# Patient Record
Sex: Male | Born: 1960 | Race: White | Hispanic: No | Smoking: Never smoker
Health system: Southern US, Community
[De-identification: ages and names within clinical notes are randomized; demographics above are authoritative.]

## PROBLEM LIST (undated history)

## (undated) DIAGNOSIS — D376 Neoplasm of uncertain behavior of liver, gallbladder and bile ducts: Secondary | ICD-10-CM

## (undated) DIAGNOSIS — F419 Anxiety disorder, unspecified: Secondary | ICD-10-CM

## (undated) DIAGNOSIS — K766 Portal hypertension: Secondary | ICD-10-CM

## (undated) DIAGNOSIS — G609 Hereditary and idiopathic neuropathy, unspecified: Secondary | ICD-10-CM

## (undated) DIAGNOSIS — I8500 Esophageal varices without bleeding: Secondary | ICD-10-CM

## (undated) DIAGNOSIS — F411 Generalized anxiety disorder: Secondary | ICD-10-CM

## (undated) DIAGNOSIS — B1710 Acute hepatitis C without hepatic coma: Secondary | ICD-10-CM

## (undated) DIAGNOSIS — F1021 Alcohol dependence, in remission: Secondary | ICD-10-CM

## (undated) DIAGNOSIS — IMO0002 Reserved for concepts with insufficient information to code with codable children: Secondary | ICD-10-CM

## (undated) DIAGNOSIS — E1149 Type 2 diabetes mellitus with other diabetic neurological complication: Secondary | ICD-10-CM

## (undated) DIAGNOSIS — D696 Thrombocytopenia, unspecified: Secondary | ICD-10-CM

## (undated) DIAGNOSIS — K7460 Unspecified cirrhosis of liver: Secondary | ICD-10-CM

## (undated) DIAGNOSIS — G20 Parkinson's disease: Secondary | ICD-10-CM

## (undated) DIAGNOSIS — J189 Pneumonia, unspecified organism: Secondary | ICD-10-CM

## (undated) DIAGNOSIS — J9601 Acute respiratory failure with hypoxia: Secondary | ICD-10-CM

## (undated) DIAGNOSIS — E785 Hyperlipidemia, unspecified: Secondary | ICD-10-CM

## (undated) DIAGNOSIS — G20A1 Parkinson's disease without dyskinesia, without mention of fluctuations: Secondary | ICD-10-CM

## (undated) DIAGNOSIS — R945 Abnormal results of liver function studies: Secondary | ICD-10-CM

## (undated) DIAGNOSIS — K8020 Calculus of gallbladder without cholecystitis without obstruction: Secondary | ICD-10-CM

## (undated) DIAGNOSIS — Q742 Other congenital malformations of lower limb(s), including pelvic girdle: Secondary | ICD-10-CM

## (undated) HISTORY — DX: Calculus of gallbladder without cholecystitis without obstruction: K80.20

## (undated) HISTORY — DX: Neoplasm of uncertain behavior of liver, gallbladder and bile ducts: D37.6

## (undated) HISTORY — DX: Alcohol dependence, in remission: F10.21

## (undated) HISTORY — DX: Esophageal varices without bleeding: I85.00

## (undated) HISTORY — DX: Parkinson's disease: G20

## (undated) HISTORY — DX: Unspecified cirrhosis of liver: K74.60

## (undated) HISTORY — DX: Anxiety disorder, unspecified: F41.9

## (undated) HISTORY — DX: Generalized anxiety disorder: F41.1

## (undated) HISTORY — DX: Portal hypertension: K76.6

## (undated) HISTORY — DX: Hyperlipidemia, unspecified: E78.5

## (undated) HISTORY — DX: Abnormal results of liver function studies: R94.5

## (undated) HISTORY — DX: Acute hepatitis C without hepatic coma: B17.10

## (undated) HISTORY — DX: Thrombocytopenia, unspecified: D69.6

## (undated) HISTORY — DX: Type 2 diabetes mellitus with other diabetic neurological complication: E11.49

## (undated) HISTORY — DX: Other congenital malformations of lower limb(s), including pelvic girdle: Q74.2

## (undated) HISTORY — DX: Hereditary and idiopathic neuropathy, unspecified: G60.9

## (undated) HISTORY — DX: Reserved for concepts with insufficient information to code with codable children: IMO0002

## (undated) HISTORY — PX: CHOLECYSTECTOMY: SHX55

## (undated) HISTORY — DX: Pneumonia, unspecified organism: J18.9

## (undated) HISTORY — DX: Acute respiratory failure with hypoxia: J96.01

---

## 2005-06-15 ENCOUNTER — Encounter (INDEPENDENT_AMBULATORY_CARE_PROVIDER_SITE_OTHER): Payer: Self-pay | Admitting: Internal Medicine

## 2006-03-06 ENCOUNTER — Emergency Department (HOSPITAL_COMMUNITY): Admission: EM | Admit: 2006-03-06 | Discharge: 2006-03-06 | Payer: Self-pay | Admitting: Emergency Medicine

## 2006-09-09 ENCOUNTER — Emergency Department (HOSPITAL_COMMUNITY): Admission: EM | Admit: 2006-09-09 | Discharge: 2006-09-09 | Payer: Self-pay | Admitting: Emergency Medicine

## 2006-11-27 ENCOUNTER — Emergency Department (HOSPITAL_COMMUNITY): Admission: EM | Admit: 2006-11-27 | Discharge: 2006-11-27 | Payer: Self-pay | Admitting: Emergency Medicine

## 2006-12-22 ENCOUNTER — Ambulatory Visit: Payer: Self-pay | Admitting: Internal Medicine

## 2006-12-28 ENCOUNTER — Encounter: Payer: Self-pay | Admitting: Internal Medicine

## 2007-01-04 ENCOUNTER — Ambulatory Visit: Payer: Self-pay | Admitting: *Deleted

## 2007-01-24 ENCOUNTER — Ambulatory Visit: Payer: Self-pay | Admitting: Internal Medicine

## 2007-01-24 DIAGNOSIS — R945 Abnormal results of liver function studies: Secondary | ICD-10-CM | POA: Insufficient documentation

## 2007-01-24 HISTORY — DX: Abnormal results of liver function studies: R94.5

## 2007-01-25 ENCOUNTER — Encounter (INDEPENDENT_AMBULATORY_CARE_PROVIDER_SITE_OTHER): Payer: Self-pay | Admitting: Internal Medicine

## 2007-02-09 ENCOUNTER — Ambulatory Visit: Payer: Self-pay | Admitting: Internal Medicine

## 2007-02-21 ENCOUNTER — Encounter: Admission: RE | Admit: 2007-02-21 | Discharge: 2007-04-12 | Payer: Self-pay | Admitting: Internal Medicine

## 2007-02-23 ENCOUNTER — Ambulatory Visit: Payer: Self-pay | Admitting: Internal Medicine

## 2007-03-07 ENCOUNTER — Ambulatory Visit: Payer: Self-pay | Admitting: Internal Medicine

## 2007-03-14 ENCOUNTER — Ambulatory Visit: Payer: Self-pay | Admitting: Internal Medicine

## 2007-03-28 ENCOUNTER — Ambulatory Visit: Payer: Self-pay | Admitting: Gastroenterology

## 2007-03-28 ENCOUNTER — Encounter (INDEPENDENT_AMBULATORY_CARE_PROVIDER_SITE_OTHER): Payer: Self-pay | Admitting: Internal Medicine

## 2007-04-03 ENCOUNTER — Ambulatory Visit: Payer: Self-pay | Admitting: Family Medicine

## 2007-04-27 ENCOUNTER — Encounter (INDEPENDENT_AMBULATORY_CARE_PROVIDER_SITE_OTHER): Payer: Self-pay | Admitting: Internal Medicine

## 2007-04-27 DIAGNOSIS — G609 Hereditary and idiopathic neuropathy, unspecified: Secondary | ICD-10-CM | POA: Insufficient documentation

## 2007-04-27 DIAGNOSIS — E119 Type 2 diabetes mellitus without complications: Secondary | ICD-10-CM | POA: Insufficient documentation

## 2007-04-27 HISTORY — DX: Hereditary and idiopathic neuropathy, unspecified: G60.9

## 2007-05-02 ENCOUNTER — Ambulatory Visit (HOSPITAL_COMMUNITY): Admission: RE | Admit: 2007-05-02 | Discharge: 2007-05-02 | Payer: Self-pay | Admitting: Gastroenterology

## 2007-05-16 DIAGNOSIS — E785 Hyperlipidemia, unspecified: Secondary | ICD-10-CM

## 2007-05-16 HISTORY — DX: Hyperlipidemia, unspecified: E78.5

## 2007-05-26 ENCOUNTER — Ambulatory Visit (HOSPITAL_COMMUNITY): Admission: RE | Admit: 2007-05-26 | Discharge: 2007-05-26 | Payer: Self-pay | Admitting: Gastroenterology

## 2007-05-26 DIAGNOSIS — E278 Other specified disorders of adrenal gland: Secondary | ICD-10-CM | POA: Insufficient documentation

## 2007-05-26 DIAGNOSIS — D376 Neoplasm of uncertain behavior of liver, gallbladder and bile ducts: Secondary | ICD-10-CM | POA: Insufficient documentation

## 2007-05-26 DIAGNOSIS — K8020 Calculus of gallbladder without cholecystitis without obstruction: Secondary | ICD-10-CM | POA: Insufficient documentation

## 2007-05-26 HISTORY — DX: Neoplasm of uncertain behavior of liver, gallbladder and bile ducts: D37.6

## 2007-05-26 HISTORY — DX: Calculus of gallbladder without cholecystitis without obstruction: K80.20

## 2007-05-28 ENCOUNTER — Telehealth (INDEPENDENT_AMBULATORY_CARE_PROVIDER_SITE_OTHER): Payer: Self-pay | Admitting: Internal Medicine

## 2007-05-29 ENCOUNTER — Encounter (INDEPENDENT_AMBULATORY_CARE_PROVIDER_SITE_OTHER): Payer: Self-pay | Admitting: Internal Medicine

## 2007-06-06 ENCOUNTER — Telehealth (INDEPENDENT_AMBULATORY_CARE_PROVIDER_SITE_OTHER): Payer: Self-pay | Admitting: Internal Medicine

## 2007-06-12 ENCOUNTER — Ambulatory Visit: Payer: Self-pay | Admitting: Nurse Practitioner

## 2007-06-12 LAB — CONVERTED CEMR LAB: Hgb A1c MFr Bld: 10.6 %

## 2007-06-14 ENCOUNTER — Encounter (INDEPENDENT_AMBULATORY_CARE_PROVIDER_SITE_OTHER): Payer: Self-pay | Admitting: Nurse Practitioner

## 2007-06-29 ENCOUNTER — Telehealth (INDEPENDENT_AMBULATORY_CARE_PROVIDER_SITE_OTHER): Payer: Self-pay | Admitting: Internal Medicine

## 2007-06-30 ENCOUNTER — Telehealth (INDEPENDENT_AMBULATORY_CARE_PROVIDER_SITE_OTHER): Payer: Self-pay | Admitting: Internal Medicine

## 2007-07-06 DIAGNOSIS — K766 Portal hypertension: Secondary | ICD-10-CM

## 2007-07-06 DIAGNOSIS — F1021 Alcohol dependence, in remission: Secondary | ICD-10-CM

## 2007-07-06 DIAGNOSIS — K7460 Unspecified cirrhosis of liver: Secondary | ICD-10-CM

## 2007-07-06 HISTORY — DX: Portal hypertension: K76.6

## 2007-07-06 HISTORY — DX: Unspecified cirrhosis of liver: K74.60

## 2007-07-06 HISTORY — DX: Alcohol dependence, in remission: F10.21

## 2007-07-07 ENCOUNTER — Encounter (INDEPENDENT_AMBULATORY_CARE_PROVIDER_SITE_OTHER): Payer: Self-pay | Admitting: Internal Medicine

## 2007-08-02 ENCOUNTER — Ambulatory Visit: Payer: Self-pay | Admitting: Gastroenterology

## 2007-08-02 LAB — CONVERTED CEMR LAB: AFP-Tumor Marker: 7.1 ng/mL (ref 0.0–8.0)

## 2007-08-17 ENCOUNTER — Encounter: Payer: Self-pay | Admitting: Gastroenterology

## 2007-08-17 ENCOUNTER — Ambulatory Visit (HOSPITAL_COMMUNITY): Admission: RE | Admit: 2007-08-17 | Discharge: 2007-08-17 | Payer: Self-pay | Admitting: Gastroenterology

## 2007-08-22 ENCOUNTER — Telehealth (INDEPENDENT_AMBULATORY_CARE_PROVIDER_SITE_OTHER): Payer: Self-pay | Admitting: Internal Medicine

## 2007-08-22 ENCOUNTER — Ambulatory Visit: Payer: Self-pay | Admitting: Gastroenterology

## 2007-09-27 DIAGNOSIS — F411 Generalized anxiety disorder: Secondary | ICD-10-CM

## 2007-09-27 DIAGNOSIS — F329 Major depressive disorder, single episode, unspecified: Secondary | ICD-10-CM

## 2007-09-27 DIAGNOSIS — K5900 Constipation, unspecified: Secondary | ICD-10-CM | POA: Insufficient documentation

## 2007-09-27 HISTORY — DX: Generalized anxiety disorder: F41.1

## 2007-10-03 ENCOUNTER — Ambulatory Visit: Payer: Self-pay | Admitting: Internal Medicine

## 2007-10-03 DIAGNOSIS — I10 Essential (primary) hypertension: Secondary | ICD-10-CM | POA: Insufficient documentation

## 2007-10-06 ENCOUNTER — Encounter (INDEPENDENT_AMBULATORY_CARE_PROVIDER_SITE_OTHER): Payer: Self-pay | Admitting: Internal Medicine

## 2007-10-13 ENCOUNTER — Telehealth (INDEPENDENT_AMBULATORY_CARE_PROVIDER_SITE_OTHER): Payer: Self-pay | Admitting: Internal Medicine

## 2007-10-17 ENCOUNTER — Encounter (INDEPENDENT_AMBULATORY_CARE_PROVIDER_SITE_OTHER): Payer: Self-pay | Admitting: Internal Medicine

## 2007-11-03 ENCOUNTER — Ambulatory Visit: Payer: Self-pay | Admitting: Internal Medicine

## 2007-11-08 ENCOUNTER — Telehealth (INDEPENDENT_AMBULATORY_CARE_PROVIDER_SITE_OTHER): Payer: Self-pay | Admitting: *Deleted

## 2007-12-28 ENCOUNTER — Ambulatory Visit: Payer: Self-pay | Admitting: Internal Medicine

## 2008-01-03 ENCOUNTER — Emergency Department (HOSPITAL_COMMUNITY): Admission: EM | Admit: 2008-01-03 | Discharge: 2008-01-03 | Payer: Self-pay | Admitting: Emergency Medicine

## 2008-02-07 ENCOUNTER — Telehealth (INDEPENDENT_AMBULATORY_CARE_PROVIDER_SITE_OTHER): Payer: Self-pay | Admitting: Internal Medicine

## 2008-02-13 ENCOUNTER — Ambulatory Visit: Payer: Self-pay | Admitting: Internal Medicine

## 2008-02-13 LAB — CONVERTED CEMR LAB
Blood Glucose, Fingerstick: 104
CO2: 19 meq/L (ref 19–32)
Calcium: 9.6 mg/dL (ref 8.4–10.5)
Creatinine, Ser: 0.48 mg/dL (ref 0.40–1.50)
Glucose, Bld: 93 mg/dL (ref 70–99)

## 2008-02-15 ENCOUNTER — Telehealth (INDEPENDENT_AMBULATORY_CARE_PROVIDER_SITE_OTHER): Payer: Self-pay | Admitting: Internal Medicine

## 2008-02-20 ENCOUNTER — Encounter (INDEPENDENT_AMBULATORY_CARE_PROVIDER_SITE_OTHER): Payer: Self-pay | Admitting: Internal Medicine

## 2008-02-29 ENCOUNTER — Ambulatory Visit (HOSPITAL_COMMUNITY): Admission: RE | Admit: 2008-02-29 | Discharge: 2008-02-29 | Payer: Self-pay | Admitting: Internal Medicine

## 2008-03-06 ENCOUNTER — Telehealth (INDEPENDENT_AMBULATORY_CARE_PROVIDER_SITE_OTHER): Payer: Self-pay | Admitting: Internal Medicine

## 2008-03-12 ENCOUNTER — Ambulatory Visit: Payer: Self-pay | Admitting: Internal Medicine

## 2008-03-12 LAB — CONVERTED CEMR LAB: Blood Glucose, Fingerstick: 140

## 2008-03-19 ENCOUNTER — Ambulatory Visit (HOSPITAL_COMMUNITY): Admission: RE | Admit: 2008-03-19 | Discharge: 2008-03-19 | Payer: Self-pay | Admitting: Internal Medicine

## 2008-03-22 ENCOUNTER — Encounter (INDEPENDENT_AMBULATORY_CARE_PROVIDER_SITE_OTHER): Payer: Self-pay | Admitting: Internal Medicine

## 2008-03-22 DIAGNOSIS — G20 Parkinson's disease: Secondary | ICD-10-CM

## 2008-03-22 HISTORY — DX: Parkinson's disease: G20

## 2008-04-02 ENCOUNTER — Encounter (INDEPENDENT_AMBULATORY_CARE_PROVIDER_SITE_OTHER): Payer: Self-pay | Admitting: *Deleted

## 2008-05-13 ENCOUNTER — Encounter (INDEPENDENT_AMBULATORY_CARE_PROVIDER_SITE_OTHER): Payer: Self-pay | Admitting: Internal Medicine

## 2008-05-16 ENCOUNTER — Encounter (INDEPENDENT_AMBULATORY_CARE_PROVIDER_SITE_OTHER): Payer: Self-pay | Admitting: Internal Medicine

## 2008-06-17 ENCOUNTER — Telehealth (INDEPENDENT_AMBULATORY_CARE_PROVIDER_SITE_OTHER): Payer: Self-pay | Admitting: Internal Medicine

## 2008-07-11 ENCOUNTER — Encounter (INDEPENDENT_AMBULATORY_CARE_PROVIDER_SITE_OTHER): Payer: Self-pay | Admitting: Internal Medicine

## 2008-07-11 ENCOUNTER — Ambulatory Visit: Payer: Self-pay | Admitting: Gastroenterology

## 2008-08-26 ENCOUNTER — Emergency Department (HOSPITAL_COMMUNITY): Admission: EM | Admit: 2008-08-26 | Discharge: 2008-08-26 | Payer: Self-pay | Admitting: Emergency Medicine

## 2008-08-26 ENCOUNTER — Telehealth (INDEPENDENT_AMBULATORY_CARE_PROVIDER_SITE_OTHER): Payer: Self-pay | Admitting: Internal Medicine

## 2008-09-11 ENCOUNTER — Telehealth (INDEPENDENT_AMBULATORY_CARE_PROVIDER_SITE_OTHER): Payer: Self-pay | Admitting: Internal Medicine

## 2008-09-11 ENCOUNTER — Encounter (INDEPENDENT_AMBULATORY_CARE_PROVIDER_SITE_OTHER): Payer: Self-pay | Admitting: Internal Medicine

## 2008-09-24 ENCOUNTER — Ambulatory Visit: Payer: Self-pay | Admitting: Internal Medicine

## 2008-09-24 LAB — CONVERTED CEMR LAB: Hgb A1c MFr Bld: 7.3 %

## 2008-10-01 ENCOUNTER — Telehealth (INDEPENDENT_AMBULATORY_CARE_PROVIDER_SITE_OTHER): Payer: Self-pay | Admitting: Internal Medicine

## 2008-10-03 ENCOUNTER — Ambulatory Visit: Payer: Self-pay | Admitting: Gastroenterology

## 2008-10-03 ENCOUNTER — Encounter (INDEPENDENT_AMBULATORY_CARE_PROVIDER_SITE_OTHER): Payer: Self-pay | Admitting: Internal Medicine

## 2008-10-08 ENCOUNTER — Encounter (INDEPENDENT_AMBULATORY_CARE_PROVIDER_SITE_OTHER): Payer: Self-pay | Admitting: *Deleted

## 2008-10-10 ENCOUNTER — Encounter (INDEPENDENT_AMBULATORY_CARE_PROVIDER_SITE_OTHER): Payer: Self-pay | Admitting: Internal Medicine

## 2008-10-10 ENCOUNTER — Ambulatory Visit: Payer: Self-pay | Admitting: Gastroenterology

## 2008-10-22 ENCOUNTER — Ambulatory Visit: Payer: Self-pay | Admitting: Internal Medicine

## 2008-10-24 ENCOUNTER — Ambulatory Visit: Payer: Self-pay | Admitting: Gastroenterology

## 2008-10-24 ENCOUNTER — Encounter (INDEPENDENT_AMBULATORY_CARE_PROVIDER_SITE_OTHER): Payer: Self-pay | Admitting: Internal Medicine

## 2008-10-29 ENCOUNTER — Ambulatory Visit: Payer: Self-pay | Admitting: Gastroenterology

## 2008-10-29 DIAGNOSIS — I8500 Esophageal varices without bleeding: Secondary | ICD-10-CM | POA: Insufficient documentation

## 2008-10-29 HISTORY — DX: Esophageal varices without bleeding: I85.00

## 2008-11-07 ENCOUNTER — Ambulatory Visit: Payer: Self-pay | Admitting: Gastroenterology

## 2008-11-07 ENCOUNTER — Encounter (INDEPENDENT_AMBULATORY_CARE_PROVIDER_SITE_OTHER): Payer: Self-pay | Admitting: Internal Medicine

## 2008-11-08 ENCOUNTER — Encounter: Payer: Self-pay | Admitting: Gastroenterology

## 2008-11-08 ENCOUNTER — Ambulatory Visit: Payer: Self-pay | Admitting: Gastroenterology

## 2008-11-11 ENCOUNTER — Telehealth: Payer: Self-pay | Admitting: Gastroenterology

## 2008-11-17 ENCOUNTER — Encounter (INDEPENDENT_AMBULATORY_CARE_PROVIDER_SITE_OTHER): Payer: Self-pay | Admitting: Internal Medicine

## 2008-11-19 ENCOUNTER — Encounter: Payer: Self-pay | Admitting: Gastroenterology

## 2008-11-21 ENCOUNTER — Encounter (INDEPENDENT_AMBULATORY_CARE_PROVIDER_SITE_OTHER): Payer: Self-pay | Admitting: Internal Medicine

## 2008-11-21 ENCOUNTER — Ambulatory Visit: Payer: Self-pay | Admitting: Gastroenterology

## 2008-12-05 ENCOUNTER — Ambulatory Visit: Payer: Self-pay | Admitting: Gastroenterology

## 2008-12-05 ENCOUNTER — Encounter (INDEPENDENT_AMBULATORY_CARE_PROVIDER_SITE_OTHER): Payer: Self-pay | Admitting: Internal Medicine

## 2008-12-26 ENCOUNTER — Ambulatory Visit: Payer: Self-pay | Admitting: Internal Medicine

## 2008-12-26 LAB — CONVERTED CEMR LAB
ALT: 210 units/L — ABNORMAL HIGH (ref 0–53)
BUN: 17 mg/dL (ref 6–23)
CO2: 17 meq/L — ABNORMAL LOW (ref 19–32)
Calcium: 9 mg/dL (ref 8.4–10.5)
Chloride: 105 meq/L (ref 96–112)
Cholesterol: 166 mg/dL (ref 0–200)
Creatinine, Ser: 0.61 mg/dL (ref 0.40–1.50)
Eosinophils Absolute: 0 10*3/uL (ref 0.0–0.7)
Eosinophils Relative: 0 % (ref 0–5)
Glucose, Bld: 89 mg/dL (ref 70–99)
HCT: 43.6 % (ref 39.0–52.0)
Lymphs Abs: 1 10*3/uL (ref 0.7–4.0)
MCV: 90.5 fL (ref 78.0–100.0)
Monocytes Absolute: 0.4 10*3/uL (ref 0.1–1.0)
Monocytes Relative: 13 % — ABNORMAL HIGH (ref 3–12)
Nitrite: POSITIVE
Protein, U semiquant: 30
RBC: 4.82 M/uL (ref 4.22–5.81)
Total CHOL/HDL Ratio: 4.7
Urobilinogen, UA: 1
WBC: 3.1 10*3/uL — ABNORMAL LOW (ref 4.0–10.5)

## 2008-12-30 ENCOUNTER — Encounter (INDEPENDENT_AMBULATORY_CARE_PROVIDER_SITE_OTHER): Payer: Self-pay | Admitting: Internal Medicine

## 2008-12-31 ENCOUNTER — Telehealth: Payer: Self-pay | Admitting: Gastroenterology

## 2009-01-03 ENCOUNTER — Encounter (INDEPENDENT_AMBULATORY_CARE_PROVIDER_SITE_OTHER): Payer: Self-pay | Admitting: Internal Medicine

## 2009-01-06 ENCOUNTER — Telehealth: Payer: Self-pay | Admitting: Gastroenterology

## 2009-01-14 ENCOUNTER — Ambulatory Visit: Payer: Self-pay | Admitting: Gastroenterology

## 2009-01-14 ENCOUNTER — Telehealth: Payer: Self-pay | Admitting: Gastroenterology

## 2009-01-16 ENCOUNTER — Ambulatory Visit: Payer: Self-pay | Admitting: Gastroenterology

## 2009-01-16 ENCOUNTER — Encounter (INDEPENDENT_AMBULATORY_CARE_PROVIDER_SITE_OTHER): Payer: Self-pay | Admitting: Internal Medicine

## 2009-01-23 ENCOUNTER — Encounter (INDEPENDENT_AMBULATORY_CARE_PROVIDER_SITE_OTHER): Payer: Self-pay | Admitting: Internal Medicine

## 2009-01-28 ENCOUNTER — Ambulatory Visit (HOSPITAL_COMMUNITY): Admission: RE | Admit: 2009-01-28 | Discharge: 2009-01-28 | Payer: Self-pay | Admitting: Gastroenterology

## 2009-02-11 ENCOUNTER — Ambulatory Visit: Payer: Self-pay | Admitting: Gastroenterology

## 2009-02-21 ENCOUNTER — Encounter (INDEPENDENT_AMBULATORY_CARE_PROVIDER_SITE_OTHER): Payer: Self-pay | Admitting: Internal Medicine

## 2009-02-21 DIAGNOSIS — B1710 Acute hepatitis C without hepatic coma: Secondary | ICD-10-CM

## 2009-02-21 HISTORY — DX: Acute hepatitis C without hepatic coma: B17.10

## 2009-03-06 ENCOUNTER — Ambulatory Visit: Payer: Self-pay | Admitting: Internal Medicine

## 2009-03-06 DIAGNOSIS — Q742 Other congenital malformations of lower limb(s), including pelvic girdle: Secondary | ICD-10-CM

## 2009-03-06 HISTORY — DX: Other congenital malformations of lower limb(s), including pelvic girdle: Q74.2

## 2009-03-06 LAB — CONVERTED CEMR LAB: Blood Glucose, Fingerstick: 125

## 2009-04-10 ENCOUNTER — Ambulatory Visit (HOSPITAL_COMMUNITY): Admission: RE | Admit: 2009-04-10 | Discharge: 2009-04-10 | Payer: Self-pay | Admitting: Sports Medicine

## 2009-05-21 ENCOUNTER — Encounter (INDEPENDENT_AMBULATORY_CARE_PROVIDER_SITE_OTHER): Payer: Self-pay | Admitting: Internal Medicine

## 2009-06-27 ENCOUNTER — Ambulatory Visit (HOSPITAL_COMMUNITY): Admission: RE | Admit: 2009-06-27 | Discharge: 2009-06-27 | Payer: Self-pay | Admitting: Orthopedic Surgery

## 2009-08-28 ENCOUNTER — Ambulatory Visit (HOSPITAL_COMMUNITY)
Admission: RE | Admit: 2009-08-28 | Discharge: 2009-08-28 | Payer: Self-pay | Admitting: Physical Medicine and Rehabilitation

## 2009-10-09 ENCOUNTER — Emergency Department (HOSPITAL_COMMUNITY): Admission: EM | Admit: 2009-10-09 | Discharge: 2009-10-09 | Payer: Self-pay | Admitting: Emergency Medicine

## 2009-10-28 ENCOUNTER — Encounter (INDEPENDENT_AMBULATORY_CARE_PROVIDER_SITE_OTHER): Payer: Self-pay | Admitting: *Deleted

## 2009-11-18 ENCOUNTER — Inpatient Hospital Stay (HOSPITAL_COMMUNITY): Admission: EM | Admit: 2009-11-18 | Discharge: 2009-11-21 | Payer: Self-pay | Admitting: Emergency Medicine

## 2009-12-20 ENCOUNTER — Emergency Department (HOSPITAL_COMMUNITY): Admission: EM | Admit: 2009-12-20 | Discharge: 2009-12-20 | Payer: Self-pay | Admitting: Emergency Medicine

## 2010-06-10 ENCOUNTER — Telehealth: Payer: Self-pay | Admitting: Gastroenterology

## 2010-09-28 ENCOUNTER — Encounter: Payer: Self-pay | Admitting: Sports Medicine

## 2010-09-28 ENCOUNTER — Encounter: Payer: Self-pay | Admitting: Gastroenterology

## 2010-10-06 NOTE — Progress Notes (Signed)
Summary: Schedule Office Visit   Phone Note Outgoing Call Call back at [facility] Phone [phone]   Call placed by: Harlow Mares CMA Duncan Dull),  June 10, 2010 11:27 AM Call placed to: Patient Summary of Call: patient mailbox is full and he can not recieve any new messages I will try to call him again at a later time.  Initial call taken by: Harlow Mares CMA Duncan Dull),  June 10, 2010 11:28 AM  Follow-up for Phone Call        patients mailbox if full, we will mail him a letter to let him know he is due for his colonoscopy Follow-up by: Harlow Mares CMA Duncan Dull),  June 19, 2010 11:32 AM

## 2010-10-06 NOTE — Letter (Signed)
Summary: Endoscopy- Changed to Office Visit  [facility]  [address] [place], [place] [address]   Phone: [phone]  Fax: [phone]      October 28, 2009 MRN: [id number]   Logan Hernandez [address] [place], [place]  [address]   Dear [name],   According to our records, it is time for you to schedule an Endoscopy. However, after reviewing your medical record, I feel that an office visit would be most appropriate to more completely evaluate you and determine your need for a repeat procedure.  Please call [phone] (option #2) at your convenience to schedule an office visit. If you have any questions, concerns, or feel that this letter is in error, we would appreciate your call.   Sincerely,  Barbette Hair. Arlyce Dice, M.D.   [facility] [phone]

## 2010-10-06 NOTE — Letter (Signed)
Summary: [facility]   ORTHOPAEDIC   Imported By: Arta Bruce 01/28/2010 10:06:56  _____________________________________________________________________  External Attachment:    Type:   Image     Comment:   External Document

## 2010-10-06 NOTE — Consult Note (Signed)
Summary: [facility]  [facility]   Imported By: Sherian Rein 02/20/2010 10:45:52  _____________________________________________________________________  External Attachment:    Type:   Image     Comment:   External Document

## 2010-10-28 ENCOUNTER — Emergency Department (HOSPITAL_COMMUNITY)
Admission: EM | Admit: 2010-10-28 | Discharge: 2010-10-28 | Disposition: A | Discharge status: Skilled Nursing Facility | Payer: Medicaid Other | Attending: Emergency Medicine | Admitting: Emergency Medicine

## 2010-10-28 ENCOUNTER — Emergency Department (HOSPITAL_COMMUNITY): Payer: Medicaid Other

## 2010-10-28 DIAGNOSIS — G20A1 Parkinson's disease without dyskinesia, without mention of fluctuations: Secondary | ICD-10-CM | POA: Insufficient documentation

## 2010-10-28 DIAGNOSIS — E119 Type 2 diabetes mellitus without complications: Secondary | ICD-10-CM | POA: Insufficient documentation

## 2010-10-28 DIAGNOSIS — R071 Chest pain on breathing: Secondary | ICD-10-CM | POA: Insufficient documentation

## 2010-10-28 DIAGNOSIS — G20 Parkinson's disease: Secondary | ICD-10-CM | POA: Insufficient documentation

## 2010-10-28 DIAGNOSIS — T1490XA Injury, unspecified, initial encounter: Secondary | ICD-10-CM | POA: Insufficient documentation

## 2010-10-28 DIAGNOSIS — Z8619 Personal history of other infectious and parasitic diseases: Secondary | ICD-10-CM | POA: Insufficient documentation

## 2010-10-28 DIAGNOSIS — Z794 Long term (current) use of insulin: Secondary | ICD-10-CM | POA: Insufficient documentation

## 2010-10-28 DIAGNOSIS — W19XXXA Unspecified fall, initial encounter: Secondary | ICD-10-CM | POA: Insufficient documentation

## 2010-10-28 DIAGNOSIS — M25569 Pain in unspecified knee: Secondary | ICD-10-CM | POA: Insufficient documentation

## 2010-10-28 DIAGNOSIS — M25559 Pain in unspecified hip: Secondary | ICD-10-CM | POA: Insufficient documentation

## 2010-11-02 ENCOUNTER — Emergency Department (HOSPITAL_COMMUNITY): Payer: Medicaid Other

## 2010-11-02 ENCOUNTER — Emergency Department (HOSPITAL_COMMUNITY)
Admission: EM | Admit: 2010-11-02 | Discharge: 2010-11-02 | Disposition: A | Discharge status: Home / Self Care | Payer: Medicaid Other | Attending: Emergency Medicine | Admitting: Emergency Medicine

## 2010-11-02 DIAGNOSIS — Z794 Long term (current) use of insulin: Secondary | ICD-10-CM | POA: Insufficient documentation

## 2010-11-02 DIAGNOSIS — R51 Headache: Secondary | ICD-10-CM | POA: Insufficient documentation

## 2010-11-02 DIAGNOSIS — R5381 Other malaise: Secondary | ICD-10-CM | POA: Insufficient documentation

## 2010-11-02 DIAGNOSIS — M542 Cervicalgia: Secondary | ICD-10-CM | POA: Insufficient documentation

## 2010-11-02 DIAGNOSIS — W19XXXA Unspecified fall, initial encounter: Secondary | ICD-10-CM | POA: Insufficient documentation

## 2010-11-02 DIAGNOSIS — G20 Parkinson's disease: Secondary | ICD-10-CM | POA: Insufficient documentation

## 2010-11-02 DIAGNOSIS — S0100XA Unspecified open wound of scalp, initial encounter: Secondary | ICD-10-CM | POA: Insufficient documentation

## 2010-11-02 DIAGNOSIS — G589 Mononeuropathy, unspecified: Secondary | ICD-10-CM | POA: Insufficient documentation

## 2010-11-02 DIAGNOSIS — Z8673 Personal history of transient ischemic attack (TIA), and cerebral infarction without residual deficits: Secondary | ICD-10-CM | POA: Insufficient documentation

## 2010-11-02 DIAGNOSIS — E119 Type 2 diabetes mellitus without complications: Secondary | ICD-10-CM | POA: Insufficient documentation

## 2010-11-02 DIAGNOSIS — G20A1 Parkinson's disease without dyskinesia, without mention of fluctuations: Secondary | ICD-10-CM | POA: Insufficient documentation

## 2010-11-02 DIAGNOSIS — M25559 Pain in unspecified hip: Secondary | ICD-10-CM | POA: Insufficient documentation

## 2010-11-02 DIAGNOSIS — Y9210 Unspecified residential institution as the place of occurrence of the external cause: Secondary | ICD-10-CM | POA: Insufficient documentation

## 2010-11-02 LAB — URINALYSIS, ROUTINE W REFLEX MICROSCOPIC
Bilirubin Urine: NEGATIVE
Hgb urine dipstick: NEGATIVE
Ketones, ur: NEGATIVE mg/dL
Nitrite: NEGATIVE
Protein, ur: NEGATIVE mg/dL
Urobilinogen, UA: 1 mg/dL (ref 0.0–1.0)

## 2010-11-02 LAB — DIFFERENTIAL
Basophils Absolute: 0 10*3/uL (ref 0.0–0.1)
Eosinophils Relative: 1 % (ref 0–5)
Lymphocytes Relative: 24 % (ref 12–46)
Monocytes Absolute: 0.4 10*3/uL (ref 0.1–1.0)

## 2010-11-02 LAB — RAPID URINE DRUG SCREEN, HOSP PERFORMED
Amphetamines: NOT DETECTED
Benzodiazepines: NOT DETECTED
Cocaine: NOT DETECTED
Opiates: NOT DETECTED
Tetrahydrocannabinol: NOT DETECTED

## 2010-11-02 LAB — BASIC METABOLIC PANEL
CO2: 26 mEq/L (ref 19–32)
Chloride: 108 mEq/L (ref 96–112)
Creatinine, Ser: 0.69 mg/dL (ref 0.4–1.5)
GFR calc Af Amer: >60 mL/min (ref >60)
Potassium: 4.7 mEq/L (ref 3.5–5.1)
Sodium: 141 mEq/L (ref 135–145)

## 2010-11-02 LAB — CBC
HCT: 38.2 % — ABNORMAL LOW (ref 39.0–52.0)
MCH: 30.1 pg (ref 26.0–34.0)
MCHC: 34.6 g/dL (ref 30.0–36.0)
RDW: 12.8 % (ref 11.5–15.5)

## 2010-11-02 LAB — POCT CARDIAC MARKERS: Myoglobin, poc: 79.5 ng/mL (ref 12–200)

## 2010-11-09 ENCOUNTER — Emergency Department (HOSPITAL_COMMUNITY): Payer: Medicaid Other

## 2010-11-09 ENCOUNTER — Emergency Department (HOSPITAL_COMMUNITY)
Admission: EM | Admit: 2010-11-09 | Discharge: 2010-11-09 | Disposition: A | Discharge status: Home / Self Care | Payer: Medicaid Other | Attending: Emergency Medicine | Admitting: Emergency Medicine

## 2010-11-09 DIAGNOSIS — G589 Mononeuropathy, unspecified: Secondary | ICD-10-CM | POA: Insufficient documentation

## 2010-11-09 DIAGNOSIS — M25559 Pain in unspecified hip: Secondary | ICD-10-CM | POA: Insufficient documentation

## 2010-11-09 DIAGNOSIS — G20 Parkinson's disease: Secondary | ICD-10-CM | POA: Insufficient documentation

## 2010-11-09 DIAGNOSIS — G20A1 Parkinson's disease without dyskinesia, without mention of fluctuations: Secondary | ICD-10-CM | POA: Insufficient documentation

## 2010-11-09 DIAGNOSIS — Z9889 Other specified postprocedural states: Secondary | ICD-10-CM | POA: Insufficient documentation

## 2010-11-09 DIAGNOSIS — E119 Type 2 diabetes mellitus without complications: Secondary | ICD-10-CM | POA: Insufficient documentation

## 2010-11-09 DIAGNOSIS — Z8673 Personal history of transient ischemic attack (TIA), and cerebral infarction without residual deficits: Secondary | ICD-10-CM | POA: Insufficient documentation

## 2010-11-24 LAB — POCT I-STAT, CHEM 8
Calcium, Ion: 1.14 mmol/L (ref 1.12–1.32)
Hemoglobin: 14.6 g/dL (ref 13.0–17.0)
Sodium: 143 mEq/L (ref 135–145)
TCO2: 23 mmol/L (ref 0–100)

## 2010-11-24 LAB — RAPID URINE DRUG SCREEN, HOSP PERFORMED
Benzodiazepines: NOT DETECTED
Cocaine: NOT DETECTED
Opiates: NOT DETECTED
Tetrahydrocannabinol: NOT DETECTED

## 2010-11-24 LAB — GLUCOSE, CAPILLARY: Glucose-Capillary: 138 mg/dL — ABNORMAL HIGH (ref 70–99)

## 2010-11-25 LAB — URINALYSIS, ROUTINE W REFLEX MICROSCOPIC
Bilirubin Urine: NEGATIVE
Glucose, UA: NEGATIVE mg/dL
Hgb urine dipstick: NEGATIVE
Protein, ur: NEGATIVE mg/dL
Specific Gravity, Urine: 1.028 (ref 1.005–1.030)
Urobilinogen, UA: 0.2 mg/dL (ref 0.0–1.0)

## 2010-11-25 LAB — POCT I-STAT, CHEM 8
BUN: 20 mg/dL (ref 6–23)
Creatinine, Ser: 0.7 mg/dL (ref 0.4–1.5)
Glucose, Bld: 128 mg/dL — ABNORMAL HIGH (ref 70–99)
Hemoglobin: 13.6 g/dL (ref 13.0–17.0)
Potassium: 4.2 mEq/L (ref 3.5–5.1)
Sodium: 138 mEq/L (ref 135–145)

## 2010-11-29 LAB — COMPREHENSIVE METABOLIC PANEL
ALT: 102 U/L — ABNORMAL HIGH (ref 0–53)
ALT: 111 U/L — ABNORMAL HIGH (ref 0–53)
ALT: 127 U/L — ABNORMAL HIGH (ref 0–53)
AST: 58 U/L — ABNORMAL HIGH (ref 0–37)
Albumin: 3.6 g/dL (ref 3.5–5.2)
Albumin: 4.3 g/dL (ref 3.5–5.2)
Alkaline Phosphatase: 40 U/L (ref 39–117)
Calcium: 9 mg/dL (ref 8.4–10.5)
Calcium: 9.1 mg/dL (ref 8.4–10.5)
Chloride: 109 mEq/L (ref 96–112)
GFR calc Af Amer: >60 mL/min (ref >60)
GFR calc Af Amer: >60 mL/min (ref >60)
GFR calc Af Amer: >60 mL/min (ref >60)
Glucose, Bld: 109 mg/dL — ABNORMAL HIGH (ref 70–99)
Glucose, Bld: 111 mg/dL — ABNORMAL HIGH (ref 70–99)
Potassium: 3.6 mEq/L (ref 3.5–5.1)
Potassium: 3.7 mEq/L (ref 3.5–5.1)
Sodium: 139 mEq/L (ref 135–145)
Sodium: 139 mEq/L (ref 135–145)
Sodium: 142 mEq/L (ref 135–145)
Total Bilirubin: 0.6 mg/dL (ref 0.3–1.2)
Total Protein: 7.2 g/dL (ref 6.0–8.3)
Total Protein: 7.5 g/dL (ref 6.0–8.3)

## 2010-11-29 LAB — GLUCOSE, CAPILLARY
Glucose-Capillary: 111 mg/dL — ABNORMAL HIGH (ref 70–99)
Glucose-Capillary: 113 mg/dL — ABNORMAL HIGH (ref 70–99)
Glucose-Capillary: 119 mg/dL — ABNORMAL HIGH (ref 70–99)
Glucose-Capillary: 130 mg/dL — ABNORMAL HIGH (ref 70–99)
Glucose-Capillary: 133 mg/dL — ABNORMAL HIGH (ref 70–99)
Glucose-Capillary: 138 mg/dL — ABNORMAL HIGH (ref 70–99)
Glucose-Capillary: 165 mg/dL — ABNORMAL HIGH (ref 70–99)
Glucose-Capillary: 176 mg/dL — ABNORMAL HIGH (ref 70–99)

## 2010-11-29 LAB — CBC
HCT: 38.9 % — ABNORMAL LOW (ref 39.0–52.0)
Hemoglobin: 14.2 g/dL (ref 13.0–17.0)
Hemoglobin: 14.4 g/dL (ref 13.0–17.0)
MCHC: 34.4 g/dL (ref 30.0–36.0)
MCHC: 35.1 g/dL (ref 30.0–36.0)
Platelets: 58 10*3/uL — ABNORMAL LOW (ref 150–400)
RBC: 4.53 MIL/uL (ref 4.22–5.81)
RDW: 13.8 % (ref 11.5–15.5)
WBC: 3.6 10*3/uL — ABNORMAL LOW (ref 4.0–10.5)
WBC: 5.4 10*3/uL (ref 4.0–10.5)

## 2010-11-29 LAB — DIFFERENTIAL
Eosinophils Absolute: 0 10*3/uL (ref 0.0–0.7)
Eosinophils Absolute: 0 10*3/uL (ref 0.0–0.7)
Lymphs Abs: 0.6 10*3/uL — ABNORMAL LOW (ref 0.7–4.0)
Lymphs Abs: 1.6 10*3/uL (ref 0.7–4.0)
Monocytes Absolute: 0.4 10*3/uL (ref 0.1–1.0)
Monocytes Relative: 10 % (ref 3–12)
Monocytes Relative: 7 % (ref 3–12)
Neutrophils Relative %: 51 % (ref 43–77)
Neutrophils Relative %: 82 % — ABNORMAL HIGH (ref 43–77)

## 2010-11-29 LAB — HEMOGLOBIN A1C
Hgb A1c MFr Bld: 6 % (ref 4.6–6.1)
Mean Plasma Glucose: 126 mg/dL

## 2010-11-29 LAB — URINALYSIS, ROUTINE W REFLEX MICROSCOPIC
Nitrite: NEGATIVE
Specific Gravity, Urine: 1.021 (ref 1.005–1.030)
Urobilinogen, UA: 1 mg/dL (ref 0.0–1.0)
pH: 5.5 (ref 5.0–8.0)

## 2010-11-29 LAB — CK TOTAL AND CKMB (NOT AT ARMC)
CK, MB: 3.7 ng/mL (ref 0.3–4.0)
Relative Index: INVALID (ref 0.0–2.5)
Total CK: 94 U/L (ref 7–232)

## 2010-11-29 LAB — TROPONIN I: Troponin I: 0.01 ng/mL (ref 0.00–0.06)

## 2010-12-15 LAB — GLUCOSE, CAPILLARY: Glucose-Capillary: 117 mg/dL — ABNORMAL HIGH (ref 70–99)

## 2010-12-17 LAB — GLUCOSE, CAPILLARY: Glucose-Capillary: 106 mg/dL — ABNORMAL HIGH (ref 70–99)

## 2011-01-07 ENCOUNTER — Emergency Department (HOSPITAL_COMMUNITY)
Admission: EM | Admit: 2011-01-07 | Discharge: 2011-01-12 | Disposition: A | Discharge status: Home / Self Care | Payer: Medicaid Other | Attending: Emergency Medicine | Admitting: Emergency Medicine

## 2011-01-07 DIAGNOSIS — G20 Parkinson's disease: Secondary | ICD-10-CM | POA: Insufficient documentation

## 2011-01-07 DIAGNOSIS — Z794 Long term (current) use of insulin: Secondary | ICD-10-CM | POA: Insufficient documentation

## 2011-01-07 DIAGNOSIS — F329 Major depressive disorder, single episode, unspecified: Secondary | ICD-10-CM | POA: Insufficient documentation

## 2011-01-07 DIAGNOSIS — E119 Type 2 diabetes mellitus without complications: Secondary | ICD-10-CM | POA: Insufficient documentation

## 2011-01-07 DIAGNOSIS — G20A1 Parkinson's disease without dyskinesia, without mention of fluctuations: Secondary | ICD-10-CM | POA: Insufficient documentation

## 2011-01-07 DIAGNOSIS — F3289 Other specified depressive episodes: Secondary | ICD-10-CM | POA: Insufficient documentation

## 2011-01-07 LAB — RAPID URINE DRUG SCREEN, HOSP PERFORMED
Amphetamines: NOT DETECTED
Barbiturates: NOT DETECTED
Opiates: NOT DETECTED

## 2011-01-07 LAB — BASIC METABOLIC PANEL
Chloride: 99 mEq/L (ref 96–112)
Creatinine, Ser: 0.71 mg/dL (ref 0.4–1.5)
GFR calc Af Amer: >60 mL/min (ref >60)
GFR calc non Af Amer: >60 mL/min (ref >60)
Potassium: 3.9 mEq/L (ref 3.5–5.1)

## 2011-01-07 LAB — ETHANOL: Alcohol, Ethyl (B): <11 mg/dL — ABNORMAL HIGH (ref 0–10)

## 2011-01-08 DIAGNOSIS — F333 Major depressive disorder, recurrent, severe with psychotic symptoms: Secondary | ICD-10-CM

## 2011-01-08 LAB — GLUCOSE, CAPILLARY

## 2011-01-09 DIAGNOSIS — F333 Major depressive disorder, recurrent, severe with psychotic symptoms: Secondary | ICD-10-CM

## 2011-01-09 LAB — GLUCOSE, CAPILLARY
Glucose-Capillary: 248 mg/dL — ABNORMAL HIGH (ref 70–99)
Glucose-Capillary: 179 mg/dL — ABNORMAL HIGH (ref 70–99)
Glucose-Capillary: 121 mg/dL — ABNORMAL HIGH (ref 70–99)

## 2011-01-10 DIAGNOSIS — F333 Major depressive disorder, recurrent, severe with psychotic symptoms: Secondary | ICD-10-CM

## 2011-01-10 LAB — GLUCOSE, CAPILLARY

## 2011-01-11 DIAGNOSIS — F333 Major depressive disorder, recurrent, severe with psychotic symptoms: Secondary | ICD-10-CM

## 2011-01-11 LAB — GLUCOSE, CAPILLARY
Glucose-Capillary: 127 mg/dL — ABNORMAL HIGH (ref 70–99)
Glucose-Capillary: 109 mg/dL — ABNORMAL HIGH (ref 70–99)

## 2011-01-12 ENCOUNTER — Encounter (HOSPITAL_COMMUNITY): Payer: Self-pay | Admitting: Radiology

## 2011-01-12 ENCOUNTER — Emergency Department (HOSPITAL_COMMUNITY)
Admission: EM | Admit: 2011-01-12 | Discharge: 2011-01-12 | Disposition: A | Discharge status: Home / Self Care | Payer: Medicaid Other | Attending: Emergency Medicine | Admitting: Emergency Medicine

## 2011-01-12 ENCOUNTER — Emergency Department (HOSPITAL_COMMUNITY): Payer: Medicaid Other

## 2011-01-12 DIAGNOSIS — M549 Dorsalgia, unspecified: Secondary | ICD-10-CM | POA: Insufficient documentation

## 2011-01-12 DIAGNOSIS — G20 Parkinson's disease: Secondary | ICD-10-CM | POA: Insufficient documentation

## 2011-01-12 DIAGNOSIS — Z8673 Personal history of transient ischemic attack (TIA), and cerebral infarction without residual deficits: Secondary | ICD-10-CM | POA: Insufficient documentation

## 2011-01-12 DIAGNOSIS — E119 Type 2 diabetes mellitus without complications: Secondary | ICD-10-CM | POA: Insufficient documentation

## 2011-01-12 DIAGNOSIS — M545 Low back pain, unspecified: Secondary | ICD-10-CM | POA: Insufficient documentation

## 2011-01-12 DIAGNOSIS — W19XXXA Unspecified fall, initial encounter: Secondary | ICD-10-CM | POA: Insufficient documentation

## 2011-01-12 DIAGNOSIS — G589 Mononeuropathy, unspecified: Secondary | ICD-10-CM | POA: Insufficient documentation

## 2011-01-12 DIAGNOSIS — R51 Headache: Secondary | ICD-10-CM | POA: Insufficient documentation

## 2011-01-12 DIAGNOSIS — G20A1 Parkinson's disease without dyskinesia, without mention of fluctuations: Secondary | ICD-10-CM | POA: Insufficient documentation

## 2011-01-12 DIAGNOSIS — Z794 Long term (current) use of insulin: Secondary | ICD-10-CM | POA: Insufficient documentation

## 2011-01-12 HISTORY — DX: Parkinson's disease: G20

## 2011-01-12 HISTORY — DX: Parkinson's disease without dyskinesia, without mention of fluctuations: G20.A1

## 2011-01-12 LAB — POCT I-STAT, CHEM 8
HCT: 44 % (ref 39.0–52.0)
Hemoglobin: 15 g/dL (ref 13.0–17.0)
Potassium: 4 mEq/L (ref 3.5–5.1)
Sodium: 141 mEq/L (ref 135–145)
TCO2: 22 mmol/L (ref 0–100)

## 2011-01-12 LAB — URINE MICROSCOPIC-ADD ON

## 2011-01-12 LAB — URINALYSIS, ROUTINE W REFLEX MICROSCOPIC
Hgb urine dipstick: NEGATIVE
Nitrite: NEGATIVE
Protein, ur: NEGATIVE mg/dL
Specific Gravity, Urine: 1.029 (ref 1.005–1.030)
Urobilinogen, UA: 1 mg/dL (ref 0.0–1.0)

## 2011-01-13 LAB — URINE CULTURE
Colony Count: NO GROWTH
Culture: NO GROWTH

## 2011-01-19 NOTE — Letter (Signed)
August 02, 2007    Hans Eden   RE:  LAKYN, MANTIONE  MRN:  [id number]  /  DOB:  1961-07-08   Dear Mr. Gatton:   It is my pleasure to have treated you recently as a new patient in my  office.  I appreciate your confidence and the opportunity to participate  in your care.   Since I do have a busy inpatient endoscopy schedule and office schedule,  my office hours vary weekly.  I am, however, available for emergency  calls every day through my office.  If I cannot promptly meet an urgent  office appointment, another one of our gastroenterologists will be able  to assist you.   My well-trained staff are prepared to help you at all times.  For  emergencies after office hours, a physician from our gastroenterology  section is always available through my 24-hour answering service.   While you are under my care, I encourage discussion of your questions  and concerns, and I will be happy to return your calls as soon as I am  available.   Once again, I welcome you as a new patient and I look forward to a happy  and healthy relationship.    Sincerely,      Barbette Hair. Arlyce Dice, MD,FACG  Electronically Signed   RDK/MedQ  DD: 08/02/2007  DT: 08/02/2007  Job #: [id number]

## 2011-01-19 NOTE — Letter (Signed)
August 02, 2007    Marcene Duos, M.D.  [address] [place] [address]   RE:  SEAB, AXEL  MRN:  [id number]  /  DOB:  08/21/1961   Dear Dr. Delrae Alfred:   Upon your kind referral, I had the pleasure of evaluating your patient  and I am pleased to offer my findings.  I saw Logan Hernandez in the office  today.  Enclosed is a copy of my progress note that details my findings  and recommendations.   Thank you for the opportunity to participate in your patient's care.    Sincerely,      Barbette Hair. Arlyce Dice, MD,FACG  Electronically Signed    RDK/MedQ  DD: 08/02/2007  DT: 08/02/2007  Job #: [id number]

## 2011-01-19 NOTE — Assessment & Plan Note (Signed)
[facility]                         GASTROENTEROLOGY OFFICE NOTE   NAME:Logan Hernandez, Logan Hernandez                         MRN:          [id number]  DATE:08/02/2007                            DOB:          15-Jan-1961    REASON FOR CONSULTATION:  Liver nodule and hepatitis C.   Mr. Steffler is a 50 year old white male referred through the courtesy of  Dr. Delrae Alfred for evaluation . He has been under Dr. Wilder Glade care  for hepatitis C.  He is a former IV drug user.  His HCV RNA quantitative  is 1,770,000.  He is virus genotype 3.  Liver tests are abnormal.  On  Jan 25, 2007, his alkaline phosphatase was 276.  His alkaline  phosphatase was normal.  AST was 275 and ALT 295.  He underwent an MRI  on May 26, 2007 that demonstrated a nodule in the right lobe of  the liver, raising the question of a dysplastic nodule or very early  hepatocellular carcinoma.  Hepatosplenomegaly was also noted, which is  consistent with cirrhosis and portal venous hypertension.  Mr. Gholson  actually feels well.  He is without abdominal pain.  He discontinued all  alcohol use, though he does not have a history of alcohol abuse.  He has  been drug-free for 2 years.   PAST MEDICAL HISTORY:  Pertinent for diabetes.  He suffers from  depression.   FAMILY HISTORY:  Noncontributory.   MEDICATIONS:  Amitriptyline.  Insulin.  Levemir.   He has no allergies.   He does not smoke.  He stopped drinking.  He is widowed and applying for  disability.  His wife recently committed suicide.   REVIEW OF SYSTEMS:  Positive for anxiety and depression.  Also positive  for constipation.   EXAM:  Pulse 100, blood pressure 124/80, weight 209.  There is no stigmata of liver disease.  On abdominal exam, liver is  palpable 4 finger-breadths below the right costal margin and percusses  to 15 mc.  Spleen is not palpable.  There are no abdominal masses.  HEENT:  EOMI. PERRLA. Sclerae are anicteric.   Conjunctivae are pink.  NECK:  Supple without thyromegaly, adenopathy or carotid bruits.  CHEST:  Clear to auscultation and percussion without adventitious  sounds.  CARDIAC:  Regular rhythm; normal S1 S2.  There are no murmurs, gallops  or rubs.  EXTREMITIES:  Full range of motion.  No cyanosis, clubbing or edema.  RECTAL:  Deferred.   IMPRESSION:  1. 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  2. Right liver nodule.  This seems to be a regenerative nodule versus      early hepatocellular carcinoma.  3. Hepatitis C.   RECOMMENDATION:  1. Check alpha-fetoprotein.  2. Follow up MRI in March 2009.  3. Upper endoscopy to evaluate for esophageal varices.  4. Treatment of hepatitis C per Dr. Foy Guadalajara.     Barbette Hair. Arlyce Dice, MD,FACG  Electronically Signed    RDK/MedQ  DD: 08/02/2007  DT: 08/02/2007  Job #: [id number]   cc:  Marcene Duos, M.D.  Sharon Mt, M.D.

## 2011-02-03 ENCOUNTER — Observation Stay (HOSPITAL_COMMUNITY)
Admission: RE | Admit: 2011-02-03 | Discharge: 2011-02-04 | Disposition: A | Discharge status: Skilled Nursing Facility | Payer: Medicaid Other | Source: Ambulatory Visit | Attending: Surgery | Admitting: Surgery

## 2011-02-03 ENCOUNTER — Other Ambulatory Visit (INDEPENDENT_AMBULATORY_CARE_PROVIDER_SITE_OTHER): Payer: Self-pay | Admitting: Surgery

## 2011-02-03 ENCOUNTER — Observation Stay (HOSPITAL_COMMUNITY): Payer: Medicaid Other

## 2011-02-03 DIAGNOSIS — Y836 Removal of other organ (partial) (total) as the cause of abnormal reaction of the patient, or of later complication, without mention of misadventure at the time of the procedure: Secondary | ICD-10-CM | POA: Insufficient documentation

## 2011-02-03 DIAGNOSIS — Y9210 Unspecified residential institution as the place of occurrence of the external cause: Secondary | ICD-10-CM | POA: Insufficient documentation

## 2011-02-03 DIAGNOSIS — B1920 Unspecified viral hepatitis C without hepatic coma: Secondary | ICD-10-CM | POA: Insufficient documentation

## 2011-02-03 DIAGNOSIS — G20 Parkinson's disease: Secondary | ICD-10-CM | POA: Insufficient documentation

## 2011-02-03 DIAGNOSIS — N9989 Other postprocedural complications and disorders of genitourinary system: Secondary | ICD-10-CM | POA: Insufficient documentation

## 2011-02-03 DIAGNOSIS — E119 Type 2 diabetes mellitus without complications: Secondary | ICD-10-CM | POA: Insufficient documentation

## 2011-02-03 DIAGNOSIS — G20A1 Parkinson's disease without dyskinesia, without mention of fluctuations: Secondary | ICD-10-CM | POA: Insufficient documentation

## 2011-02-03 DIAGNOSIS — K8010 Calculus of gallbladder with chronic cholecystitis without obstruction: Principal | ICD-10-CM | POA: Insufficient documentation

## 2011-02-03 DIAGNOSIS — IMO0002 Reserved for concepts with insufficient information to code with codable children: Secondary | ICD-10-CM | POA: Insufficient documentation

## 2011-02-03 DIAGNOSIS — R338 Other retention of urine: Secondary | ICD-10-CM | POA: Insufficient documentation

## 2011-02-03 LAB — GLUCOSE, CAPILLARY
Glucose-Capillary: 102 mg/dL — ABNORMAL HIGH (ref 70–99)
Glucose-Capillary: 113 mg/dL — ABNORMAL HIGH (ref 70–99)
Glucose-Capillary: 139 mg/dL — ABNORMAL HIGH (ref 70–99)

## 2011-02-03 LAB — BASIC METABOLIC PANEL
BUN: 10 mg/dL (ref 6–23)
Creatinine, Ser: 0.55 mg/dL (ref 0.4–1.5)
GFR calc Af Amer: >60 mL/min (ref >60)
GFR calc non Af Amer: >60 mL/min (ref >60)
Potassium: 4.2 mEq/L (ref 3.5–5.1)

## 2011-02-03 LAB — CBC
MCV: 87.6 fL (ref 78.0–100.0)
Platelets: 62 10*3/uL — ABNORMAL LOW (ref 150–400)
RDW: 13.3 % (ref 11.5–15.5)
WBC: 3.3 10*3/uL — ABNORMAL LOW (ref 4.0–10.5)

## 2011-02-03 LAB — SURGICAL PCR SCREEN: Staphylococcus aureus: NEGATIVE

## 2011-02-04 LAB — GLUCOSE, CAPILLARY

## 2011-02-18 NOTE — Op Note (Signed)
NAMESTEFON, Logan Hernandez                ACCOUNT NO.:  [id number]  MEDICAL RECORD NO.:  [id number]           PATIENT TYPE:  O  LOCATION:  5149                         FACILITY:  [facility]  PHYSICIAN:  Abigail Miyamoto, M.D. DATE OF BIRTH:  02/09/1961  DATE OF PROCEDURE:  02/03/2011 DATE OF DISCHARGE:                              OPERATIVE REPORT   PREOPERATIVE DIAGNOSIS:  Symptomatic cholelithiasis.  POSTOPERATIVE DIAGNOSIS:  Symptomatic cholelithiasis.  PROCEDURE:  Laparoscopic cholecystectomy with intraoperative cholangiogram.  SURGEON:  Abigail Miyamoto, MD  ANESTHESIA:  General and 0.5% Marcaine.  ESTIMATED BLOOD LOSS:  Minimal.  FINDINGS:  The patient was found to have a cirrhotic-appearing liver. Cholangiogram was normal.  INDICATION:  Logan Hernandez is a 50 year old gentleman who is in a rehab facility with severe Parkinson disease.  He has been found to have right upper quadrant pain, gallstones and elevated liver function tests.  He also has a known history of hepatitis C.  PROCEDURE IN DETAIL:  The patient was brought into the operative room, identified as Logan Hernandez.  He was placed supine on the operating room table and general anesthesia was induced.  His abdomen was then prepped and draped in the usual sterile fashion.  Using a #15 blade, a small vertical incision was made above the umbilicus.  This was carried down to the fascia which was then opened with a scalpel.  A hemostat was then used to pass the peritoneal cavity under direct vision.  Next, a 0 Vicryl pursestring suture was placed around the fascial opening.  The Hasson port was placed through the opening and insufflation of the abdomen was begun.  A 5-mm port was then placed in the patient's epigastrium and 2 more in the right upper quadrant, all under direct vision.  The liver was found to be cirrhotic in appearance.  I was able to identify the gallbladder, grasped and retracted above the liver bed. The  cystic duct and cystic artery were then easily dissected out and a critical window was achieved around both.  I clipped the artery three times proximally, once distally along the posterior branch.  I clipped the cystic duct once distally and opened with laparoscopic scissors. The cholangiocatheter was then inserted in the right upper quadrant under direct vision is placed into the opening of the cystic duct. Cholangiogram was then performed with contrast.  There was no evidence of obstruction of the biliary system.  At this point cholangiocatheter was removed.  I clipped cyst duct 3 times proximally and completely transected it.  I then transected the cystic artery as well.  I then slowly dissected the gallbladder free from liver bed with electrocautery.  Hemostasis was appeared to be easily achieved in the liver bed once the gallbladder was completely removed.  I then removed the gallbladder through the incision at the umbilicus.  I then closed the fascial defect at the umbilicus by tying off the 0 Vicryl suture.  I then irrigated the abdomen with saline.  Again hemostasis from the liver bed appear to be achieved.  All ports were removed under direct vision and the abdomen was deflated.  All incisions  were then anesthetized with Marcaine and closed with 4-0 Monocryl subcuticular sutures.  Steri-Strips and Band-Aids were then applied.  The patient tolerated the procedure well.  All counts were correct at the end of procedure.  The patient was then extubated in the operating room and taken in stable condition to recovery room.     Abigail Miyamoto, M.D.     DB/MEDQ  D:  02/03/2011  T:  02/03/2011  Job:  [id number]  Electronically Signed by Abigail Miyamoto M.D. on 02/18/2011 07:05:27 AM

## 2011-02-18 NOTE — Discharge Summary (Signed)
  Logan Hernandez, Logan Hernandez                ACCOUNT NO.:  [id number]  MEDICAL RECORD NO.:  [id number]           PATIENT TYPE:  O  LOCATION:  5149                         FACILITY:  [facility]  PHYSICIAN:  Abigail Miyamoto, M.D. DATE OF BIRTH:  11-07-1960  DATE OF ADMISSION:  02/03/2011 DATE OF DISCHARGE:  02/04/2011                              DISCHARGE SUMMARY   DISCHARGE DIAGNOSES: 1. Asymptomatic cholelithiasis, status post laparoscopic     cholecystectomy. 2. History of Parkinson disease. 3. Urinary retention. 4. Diabetes.  SUMMARY OF HISTORY:  This is a 50 year old gentleman who is a resident of [facility], who presents with symptomatic cholelithiasis.  Decision was made to proceed with laparoscopic cholecystectomy.  HOSPITAL COURSE:  The patient was admitted, taken to the operating room, where he underwent an uneventful laparoscopic cholecystectomy with a cholangiogram.  Cholangiogram was normal.  The patient did have evidence of cirrhosis visualized in the liver during cholecystectomy. Postoperatively, he was taken in a stable condition to regular surgical floor.  He did have postoperative urinary retention and had have a Foley catheter placed on postop day #1.  He was doing quite well.  His abdomen was soft.  He was tolerating a diet.  Decision was made to remove the Foley and then to discharge him back to [facility].  DISCHARGE DIET:  Diabetic.  DISCHARGE ACTIVITY:  As tolerated.  WOUND CARE:  The bandage may be removed, and he may shower.  DISCHARGE FOLLOWUP:  He will need to follow up with Dr. Abigail Miyamoto at [facility] in 2-3 weeks after discharge.  MEDICATIONS:  Please see medical reconciliation form.     Abigail Miyamoto, M.D.     DB/MEDQ  D:  02/04/2011  T:  02/04/2011  Job:  [id number]  Electronically Signed by Abigail Miyamoto M.D. on 02/18/2011 07:05:29 AM

## 2011-02-22 ENCOUNTER — Encounter (INDEPENDENT_AMBULATORY_CARE_PROVIDER_SITE_OTHER): Payer: Self-pay | Admitting: Surgery

## 2011-05-18 ENCOUNTER — Emergency Department (HOSPITAL_COMMUNITY): Payer: Medicaid Other

## 2011-05-18 ENCOUNTER — Emergency Department (HOSPITAL_COMMUNITY)
Admission: EM | Admit: 2011-05-18 | Discharge: 2011-05-18 | Disposition: A | Discharge status: Home / Self Care | Payer: Medicaid Other | Attending: Emergency Medicine | Admitting: Emergency Medicine

## 2011-05-18 DIAGNOSIS — Z8679 Personal history of other diseases of the circulatory system: Secondary | ICD-10-CM | POA: Insufficient documentation

## 2011-05-18 DIAGNOSIS — M25559 Pain in unspecified hip: Secondary | ICD-10-CM | POA: Insufficient documentation

## 2011-05-18 DIAGNOSIS — Y9210 Unspecified residential institution as the place of occurrence of the external cause: Secondary | ICD-10-CM | POA: Insufficient documentation

## 2011-05-18 DIAGNOSIS — S7000XA Contusion of unspecified hip, initial encounter: Secondary | ICD-10-CM | POA: Insufficient documentation

## 2011-05-18 DIAGNOSIS — W19XXXA Unspecified fall, initial encounter: Secondary | ICD-10-CM | POA: Insufficient documentation

## 2011-05-18 DIAGNOSIS — E119 Type 2 diabetes mellitus without complications: Secondary | ICD-10-CM | POA: Insufficient documentation

## 2011-05-18 DIAGNOSIS — G20 Parkinson's disease: Secondary | ICD-10-CM | POA: Insufficient documentation

## 2011-05-18 DIAGNOSIS — G20A1 Parkinson's disease without dyskinesia, without mention of fluctuations: Secondary | ICD-10-CM | POA: Insufficient documentation

## 2011-05-18 LAB — TYPE AND SCREEN
ABO/RH(D): A NEG
Antibody Screen: NEGATIVE

## 2011-05-18 LAB — CBC
HCT: 38.4 % — ABNORMAL LOW (ref 39.0–52.0)
Hemoglobin: 13.6 g/dL (ref 13.0–17.0)
MCV: 85.1 fL (ref 78.0–100.0)
Platelets: 62 10*3/uL — ABNORMAL LOW (ref 150–400)
RBC: 4.51 MIL/uL (ref 4.22–5.81)
WBC: 3 10*3/uL — ABNORMAL LOW (ref 4.0–10.5)

## 2011-05-18 LAB — APTT: aPTT: 28 seconds (ref 24–37)

## 2011-05-18 LAB — BASIC METABOLIC PANEL
CO2: 27 mEq/L (ref 19–32)
Calcium: 9.2 mg/dL (ref 8.4–10.5)
Potassium: 3.7 mEq/L (ref 3.5–5.1)
Sodium: 142 mEq/L (ref 135–145)

## 2011-06-01 LAB — BASIC METABOLIC PANEL
BUN: 7
Calcium: 8.8
Creatinine, Ser: 0.61
GFR calc non Af Amer: >60

## 2011-06-01 LAB — URINALYSIS, ROUTINE W REFLEX MICROSCOPIC
Glucose, UA: >1000 — AB
Ketones, ur: 15 — AB
Leukocytes, UA: NEGATIVE
Protein, ur: NEGATIVE

## 2011-06-01 LAB — RAPID URINE DRUG SCREEN, HOSP PERFORMED
Benzodiazepines: NOT DETECTED
Cocaine: NOT DETECTED
Tetrahydrocannabinol: NOT DETECTED

## 2011-06-01 LAB — DIFFERENTIAL
Eosinophils Absolute: 0
Lymphocytes Relative: 13
Lymphs Abs: 0.7
Neutrophils Relative %: 80 — ABNORMAL HIGH

## 2011-06-01 LAB — CBC
Platelets: 88 — ABNORMAL LOW
WBC: 5.4

## 2011-06-01 LAB — ETHANOL: Alcohol, Ethyl (B): 190 — ABNORMAL HIGH

## 2011-06-03 LAB — CREATININE, SERUM
GFR calc Af Amer: >60
GFR calc non Af Amer: >60

## 2011-06-03 LAB — BUN: BUN: 10

## 2011-06-11 LAB — CBC
HCT: 48.3 % (ref 39.0–52.0)
Hemoglobin: 16.6 g/dL (ref 13.0–17.0)
MCV: 91.6 fL (ref 78.0–100.0)
Platelets: 91 10*3/uL — ABNORMAL LOW (ref 150–400)
RDW: 13.9 % (ref 11.5–15.5)
WBC: 5.8 10*3/uL (ref 4.0–10.5)

## 2011-06-11 LAB — DIFFERENTIAL
Basophils Absolute: 0 10*3/uL (ref 0.0–0.1)
Basophils Relative: 1 % (ref 0–1)
Lymphocytes Relative: 28 % (ref 12–46)
Neutro Abs: 3.6 10*3/uL (ref 1.7–7.7)
Neutrophils Relative %: 62 % (ref 43–77)

## 2011-06-11 LAB — COMPREHENSIVE METABOLIC PANEL
Albumin: 4.1 g/dL (ref 3.5–5.2)
Alkaline Phosphatase: 62 U/L (ref 39–117)
BUN: 9 mg/dL (ref 6–23)
Chloride: 106 mEq/L (ref 96–112)
Creatinine, Ser: 0.61 mg/dL (ref 0.4–1.5)
Glucose, Bld: 98 mg/dL (ref 70–99)
Potassium: 4 mEq/L (ref 3.5–5.1)
Total Bilirubin: 1.5 mg/dL — ABNORMAL HIGH (ref 0.3–1.2)

## 2011-06-17 LAB — CREATININE, SERUM
Creatinine, Ser: 0.61
GFR calc Af Amer: >60
GFR calc non Af Amer: >60

## 2011-06-17 LAB — BUN: BUN: 10

## 2011-07-08 ENCOUNTER — Emergency Department (HOSPITAL_COMMUNITY): Payer: Medicaid Other

## 2011-07-08 ENCOUNTER — Emergency Department (HOSPITAL_COMMUNITY)
Admission: EM | Admit: 2011-07-08 | Discharge: 2011-07-08 | Disposition: A | Discharge status: Home / Self Care | Payer: Medicaid Other | Attending: Emergency Medicine | Admitting: Emergency Medicine

## 2011-07-08 DIAGNOSIS — G20 Parkinson's disease: Secondary | ICD-10-CM | POA: Insufficient documentation

## 2011-07-08 DIAGNOSIS — E1169 Type 2 diabetes mellitus with other specified complication: Secondary | ICD-10-CM | POA: Insufficient documentation

## 2011-07-08 DIAGNOSIS — R4182 Altered mental status, unspecified: Secondary | ICD-10-CM | POA: Insufficient documentation

## 2011-07-08 DIAGNOSIS — G20A1 Parkinson's disease without dyskinesia, without mention of fluctuations: Secondary | ICD-10-CM | POA: Insufficient documentation

## 2011-07-08 DIAGNOSIS — Z794 Long term (current) use of insulin: Secondary | ICD-10-CM | POA: Insufficient documentation

## 2011-07-08 DIAGNOSIS — Z66 Do not resuscitate: Secondary | ICD-10-CM | POA: Insufficient documentation

## 2011-07-08 DIAGNOSIS — Z8673 Personal history of transient ischemic attack (TIA), and cerebral infarction without residual deficits: Secondary | ICD-10-CM | POA: Insufficient documentation

## 2011-07-08 LAB — URINALYSIS, ROUTINE W REFLEX MICROSCOPIC
Bilirubin Urine: NEGATIVE
Nitrite: NEGATIVE
Specific Gravity, Urine: 1.03 (ref 1.005–1.030)
Urobilinogen, UA: 1 mg/dL (ref 0.0–1.0)

## 2011-07-08 LAB — URINE MICROSCOPIC-ADD ON

## 2011-07-08 LAB — CBC
MCV: 84.5 fL (ref 78.0–100.0)
Platelets: 66 10*3/uL — ABNORMAL LOW (ref 150–400)
RBC: 4.58 MIL/uL (ref 4.22–5.81)
RDW: 13 % (ref 11.5–15.5)
WBC: 6.4 10*3/uL (ref 4.0–10.5)

## 2011-07-08 LAB — DIFFERENTIAL
Basophils Absolute: 0 10*3/uL (ref 0.0–0.1)
Eosinophils Absolute: 0 10*3/uL (ref 0.0–0.7)
Eosinophils Relative: 0 % (ref 0–5)
Lymphs Abs: 0.8 10*3/uL (ref 0.7–4.0)
Monocytes Absolute: 0.6 10*3/uL (ref 0.1–1.0)
Neutrophils Relative %: 78 % — ABNORMAL HIGH (ref 43–77)

## 2011-07-08 LAB — POCT I-STAT, CHEM 8
Calcium, Ion: 1.14 mmol/L (ref 1.12–1.32)
Glucose, Bld: 45 mg/dL — ABNORMAL LOW (ref 70–99)
HCT: 40 % (ref 39.0–52.0)
Hemoglobin: 13.6 g/dL (ref 13.0–17.0)
TCO2: 23 mmol/L (ref 0–100)

## 2011-07-08 LAB — GLUCOSE, CAPILLARY: Glucose-Capillary: 170 mg/dL — ABNORMAL HIGH (ref 70–99)

## 2011-12-05 ENCOUNTER — Emergency Department (HOSPITAL_COMMUNITY)
Admission: EM | Admit: 2011-12-05 | Discharge: 2011-12-06 | Disposition: A | Discharge status: Rehabilitation Facility | Payer: Medicaid Other | Attending: Emergency Medicine | Admitting: Emergency Medicine

## 2011-12-05 ENCOUNTER — Emergency Department (HOSPITAL_COMMUNITY): Payer: Medicaid Other

## 2011-12-05 ENCOUNTER — Encounter (HOSPITAL_COMMUNITY): Payer: Self-pay | Admitting: *Deleted

## 2011-12-05 ENCOUNTER — Other Ambulatory Visit: Payer: Self-pay

## 2011-12-05 DIAGNOSIS — Z794 Long term (current) use of insulin: Secondary | ICD-10-CM | POA: Insufficient documentation

## 2011-12-05 DIAGNOSIS — G20 Parkinson's disease: Secondary | ICD-10-CM | POA: Insufficient documentation

## 2011-12-05 DIAGNOSIS — E119 Type 2 diabetes mellitus without complications: Secondary | ICD-10-CM | POA: Insufficient documentation

## 2011-12-05 DIAGNOSIS — E162 Hypoglycemia, unspecified: Secondary | ICD-10-CM

## 2011-12-05 DIAGNOSIS — F0390 Unspecified dementia without behavioral disturbance: Secondary | ICD-10-CM | POA: Insufficient documentation

## 2011-12-05 DIAGNOSIS — G20A1 Parkinson's disease without dyskinesia, without mention of fluctuations: Secondary | ICD-10-CM | POA: Insufficient documentation

## 2011-12-05 DIAGNOSIS — R4182 Altered mental status, unspecified: Secondary | ICD-10-CM | POA: Insufficient documentation

## 2011-12-05 LAB — URINALYSIS, ROUTINE W REFLEX MICROSCOPIC
Glucose, UA: NEGATIVE mg/dL
Hgb urine dipstick: NEGATIVE
Specific Gravity, Urine: 1.028 (ref 1.005–1.030)
Urobilinogen, UA: 1 mg/dL (ref 0.0–1.0)
pH: 5.5 (ref 5.0–8.0)

## 2011-12-05 LAB — CBC
Hemoglobin: 13.2 g/dL (ref 13.0–17.0)
Platelets: 99 10*3/uL — ABNORMAL LOW (ref 150–400)
RBC: 4.39 MIL/uL (ref 4.22–5.81)
WBC: 7.4 10*3/uL (ref 4.0–10.5)

## 2011-12-05 LAB — URINE MICROSCOPIC-ADD ON

## 2011-12-05 LAB — BASIC METABOLIC PANEL
CO2: 26 mEq/L (ref 19–32)
Chloride: 105 mEq/L (ref 96–112)
Glucose, Bld: 49 mg/dL — ABNORMAL LOW (ref 70–99)
Potassium: 3.5 mEq/L (ref 3.5–5.1)
Sodium: 141 mEq/L (ref 135–145)

## 2011-12-05 LAB — DIFFERENTIAL
Lymphocytes Relative: 12 % (ref 12–46)
Lymphs Abs: 0.9 10*3/uL (ref 0.7–4.0)
Neutrophils Relative %: 86 % — ABNORMAL HIGH (ref 43–77)

## 2011-12-05 MED ORDER — DEXTROSE 50 % IV SOLN
INTRAVENOUS | Status: AC
  Administered 2011-12-05: 50 mL via INTRAVENOUS
  Filled 2011-12-05: qty 50

## 2011-12-05 MED ORDER — SODIUM CHLORIDE 0.9 % IR SOLN: Freq: Once | Status: DC

## 2011-12-05 MED ORDER — SODIUM CHLORIDE 0.9 % IV SOLN
INTRAVENOUS | Status: DC
  Administered 2011-12-05: 23:00:00 via INTRAVENOUS

## 2011-12-05 MED ORDER — DEXTROSE 50 % IV SOLN
1.0000 | Freq: Once | INTRAVENOUS | Status: AC
  Administered 2011-12-05: 50 mL via INTRAVENOUS

## 2011-12-05 NOTE — ED Notes (Signed)
Pt is from [facility] where he is a resident.  Pt has not been "acting himself" today.  Last time he was felt to be at his baseline per staff was this am at 10.  Pt cbg 176, EKG WNL.  Pt is normally verbally responsive but has not been today.  Pt with hx of parkinsons and several other chronic illnesses.

## 2011-12-05 NOTE — ED Notes (Signed)
Pt became verbally responsive and was alert for ems.

## 2011-12-06 ENCOUNTER — Encounter: Payer: Self-pay | Admitting: Gastroenterology

## 2011-12-06 ENCOUNTER — Emergency Department (HOSPITAL_COMMUNITY): Payer: Medicaid Other

## 2011-12-06 NOTE — Discharge Instructions (Signed)
Pt's workup here was all OK except for his blood sugar which was 49. This was corrected in ED. Make sure he sees his doctor as soon as possible for insulin adjustment. Also check CBG every 4 hrs for the next 12 hrs. Make sure he eats well if receiving insulin otherwise his dose will need to be adjusted. Follow up. Return if worsening.   Hypoglycemia (Low Blood Sugar) Hypoglycemia is when the glucose (sugar) in your blood is too low. Hypoglycemia can happen for many reasons. It can happen to people with or without diabetes. Hypoglycemia can develop quickly and can be a medical emergency.  CAUSES  Having hypoglycemia does not mean that you will develop diabetes. Different causes include:  Missed or delayed meals or not enough carbohydrates eaten.   Medication overdose. This could be by accident or deliberate. If by accident, your medication may need to be adjusted or changed.   Exercise or increased activity without adjustments in carbohydrates or medications.   A nerve disorder that affects body functions like your heart rate, blood pressure and digestion (autonomic neuropathy).   A condition where the stomach muscles do not function properly (gastroparesis). Therefore, medications may not absorb properly.   The inability to recognize the signs of hypoglycemia (hypoglycemic unawareness).   Absorption of insulin - may be altered.   Alcohol consumption.   Pregnancy/menstrual cycles/postpartum. This may be due to hormones.   Certain kinds of tumors. This is very rare.  SYMPTOMS   Sweating.   Hunger.   Dizziness.   Blurred vision.   Drowsiness.   Weakness.   Headache.   Rapid heart beat.   Shakiness.   Nervousness.  DIAGNOSIS  Diagnosis is made by monitoring blood glucose in one or all of the following ways:  Fingerstick blood glucose monitoring.   Laboratory results.  TREATMENT  If you think your blood glucose is low:  Check your blood glucose, if possible. If it  is less than 70 mg/dl, take one of the following:   3-4 glucose tablets.    cup juice (prefer clear like apple).    cup "regular" soda pop.   1 cup milk.   -1 tube of glucose gel.   5-6 hard candies.   Do not over treat because your blood glucose (sugar) will only go too high.   Wait 15 minutes and recheck your blood glucose. If it is still less than 70 mg/dl (or below your target range), repeat treatment.   Eat a snack if it is more than one hour until your next meal.  Sometimes, your blood glucose may go so low that you are unable to treat yourself. You may need someone to help you. You may even pass out or be unable to swallow. This may require you to get an injection of glucagon, which raises the blood glucose. HOME CARE INSTRUCTIONS  Check blood glucose as recommended by your caregiver.   Take medication as prescribed by your caregiver.   Follow your meal plan. Do not skip meals. Eat on time.   If you are going to drink alcohol, drink it only with meals.   Check your blood glucose before driving.   Check your blood glucose before and after exercise. If you exercise longer or different than usual, be sure to check blood glucose more frequently.   Always carry treatment with you. Glucose tablets are the easiest to carry.   Always wear medical alert jewelry or carry some form of identification that states that you  have diabetes. This will alert people that you have diabetes. If you have hypoglycemia, they will have a better idea on what to do.  SEEK MEDICAL CARE IF:   You are having problems keeping your blood sugar at target range.   You are having frequent episodes of hypoglycemia.   You feel you might be having side effects from your medicines.   You have symptoms of an illness that is not improving after 3-4 days.   You notice a change in vision or a new problem with your vision.  SEEK IMMEDIATE MEDICAL CARE IF:   You are a family member or friend of a  person whose blood glucose goes below 70 mg/dl and is accompanied by:   Confusion.   A change in mental status.   The inability to swallow.   Passing out.  Document Released: 08/23/2005 Document Revised: 08/12/2011 Document Reviewed: 04/17/2009 [facility] Patient Information 2012 [place], [place].

## 2011-12-06 NOTE — ED Provider Notes (Signed)
History     CSN: [id number]  Arrival date & time 12/05/11  2154   First MD Initiated Contact with Patient 12/05/11 2258      Chief Complaint  Patient presents with  . Altered Mental Status    (Consider location/radiation/quality/duration/timing/severity/associated sxs/prior treatment) Patient is a 51 y.o. male presenting with altered mental status. The history is provided by the patient, the nursing home and the EMS personnel.  Altered Mental Status This is a new problem. The current episode started today.  Per EMS and nursing home, pt has not been "himself today." He is at baseline talkative and able to carry on a simple conversation, today however he was not answering question. Pt has not had any fever, nausea/vomiting/diarrhea, or complained of any pain. Pt has not had any known falls. Pt tells me he has no complaints.  Past Medical History  Diagnosis Date  . Parkinson disease   . Diabetes mellitus   . Ulcer     Past Surgical History  Procedure Date  . Cholecystectomy     No family history on file.  History  Substance Use Topics  . Smoking status: Never Smoker   . Smokeless tobacco: Never Used  . Alcohol Use: No      Review of Systems  Unable to perform ROS: Dementia  Psychiatric/Behavioral: Positive for altered mental status.    Allergies  Review of patient's allergies indicates no known allergies.  Home Medications   Current Outpatient Rx  Name Route Sig Dispense Refill  . CARBIDOPA-LEVODOPA 10-100 MG PO TABS Oral Take 1 tablet by mouth 3 (three) times daily.    Marland Kitchen VITAMIN D3 1000 UNITS PO TABS Oral Take 1,000 Units by mouth daily.    Marland Kitchen DOCUSATE SODIUM 100 MG PO CAPS Oral Take 100 mg by mouth 2 (two) times daily.    Marland Kitchen HYDROCODONE-ACETAMINOPHEN 5-325 MG PO TABS Oral Take 1 tablet by mouth every 4 (four) hours as needed. For painx    . INSULIN ASPART PROT & ASPART (70-30) 100 UNIT/ML [place] SUSP Subcutaneous Inject 22-27 Units into the skin 2 (two) times daily  with a meal. Take 22 units in the morning and 27 units at night    . MAGNESIUM HYDROXIDE 400 MG/5ML PO SUSP Oral Take 30 mLs by mouth daily as needed. For constipation    . PRAMIPEXOLE DIHYDROCHLORIDE 0.25 MG PO TABS Oral Take 0.25 mg by mouth 3 (three) times daily.    . QUETIAPINE FUMARATE 25 MG PO TABS Oral Take 25 mg by mouth 2 (two) times daily.    . QUETIAPINE FUMARATE 50 MG PO TABS Oral Take 50 mg by mouth at bedtime.    . VENLAFAXINE HCL 75 MG PO TABS Oral Take 75 mg by mouth daily.      BP 129/84  Pulse 99  Temp(Src) 97.5 F (36.4 C) (Oral)  Resp 25  SpO2 98%  Physical Exam  Nursing note and vitals reviewed. Constitutional: He appears well-developed and well-nourished.  HENT:  Head: Normocephalic.  Eyes: Conjunctivae and EOM are normal. Pupils are equal, round, and reactive to light.  Neck: Neck supple.  Cardiovascular: Normal rate, regular rhythm and normal heart sounds.   Pulmonary/Chest: Effort normal and breath sounds normal. No respiratory distress. He has no wheezes.  Abdominal: Soft. Bowel sounds are normal.  Musculoskeletal: Normal range of motion. He exhibits no edema.  Neurological: He is alert. No cranial nerve deficit. He exhibits normal muscle tone. Coordination abnormal.       Pt  oriented to self and place. He is able to answer simple question. He has equal grip strength bilaterally. unable to perform finger to nose. Pt able to move bilateral LE.   Skin: Skin is warm and dry.  Psychiatric: He has a normal mood and affect.    ED Course  Procedures (including critical care time)  Labs Reviewed  CBC - Abnormal; Notable for the following:    HCT 38.0 (*)    Platelets 99 (*) CONSISTENT WITH PREVIOUS RESULT   All other components within normal limits  DIFFERENTIAL - Abnormal; Notable for the following:    Neutrophils Relative 86 (*)    Monocytes Relative 1 (*)    All other components within normal limits  BASIC METABOLIC PANEL - Abnormal; Notable for the  following:    Glucose, Bld 49 (*)    All other components within normal limits  URINALYSIS, ROUTINE W REFLEX MICROSCOPIC - Abnormal; Notable for the following:    Color, Urine AMBER (*) BIOCHEMICALS MAY BE AFFECTED BY COLOR   Bilirubin Urine SMALL (*)    Ketones, ur 15 (*)    Leukocytes, UA SMALL (*)    All other components within normal limits  URINE MICROSCOPIC-ADD ON - Abnormal; Notable for the following:    Crystals CA OXALATE CRYSTALS (*)    All other components within normal limits  GLUCOSE, CAPILLARY - Abnormal; Notable for the following:    Glucose-Capillary 171 (*)    All other components within normal limits  POCT I-STAT TROPONIN I   Dg Chest 2 View  12/05/2011  *RADIOLOGY REPORT*  Clinical Data: Altered mental status.  CHEST - 2 VIEW  Comparison: 07/08/2011  Findings: Shallow inspiration.  Heart size and pulmonary vascularity are normal for technique.  Tortuous and ectatic thoracic aorta.  No focal airspace consolidation.  No blunting of costophrenic angles.  No pneumothorax.  Surgical clips in the right upper quadrant.  IMPRESSION: Shallow inspiration.  No evidence of active pulmonary disease.  Original Report Authenticated By: Marlon Pel, M.D.   Pt's work up here is negative here except for glucose of 49. Amp of D50 given. Glu back up to 171. Pt is alert, talking, answering questions appropriately. Per chart review, hx of similar episodes. I think pt is stable for d/c with close monitoring of his glucose. His VS are all within normal. He appears at his baseline.  Will d/c home.   No diagnosis found.     MDM          Lottie Mussel, PA 12/09/11 [phone]

## 2011-12-09 IMAGING — RF DG CHOLANGIOGRAM OPERATIVE
1 series · 4 of 4 positions shown · non-contrast
Comparison: None.

CLINICAL DATA: Cholecystectomy

INTRAOPERATIVE CHOLANGIOGRAM
TECHNIQUE: Cholangiographic images from the C-arm fluoroscopic
device were submitted for interpretation post-operatively.  Please
see the procedural report for the amount of contrast and the
fluoroscopy time utilized.

[Series 1: run · 4 of 23 frames shown]
[frame 4/23]
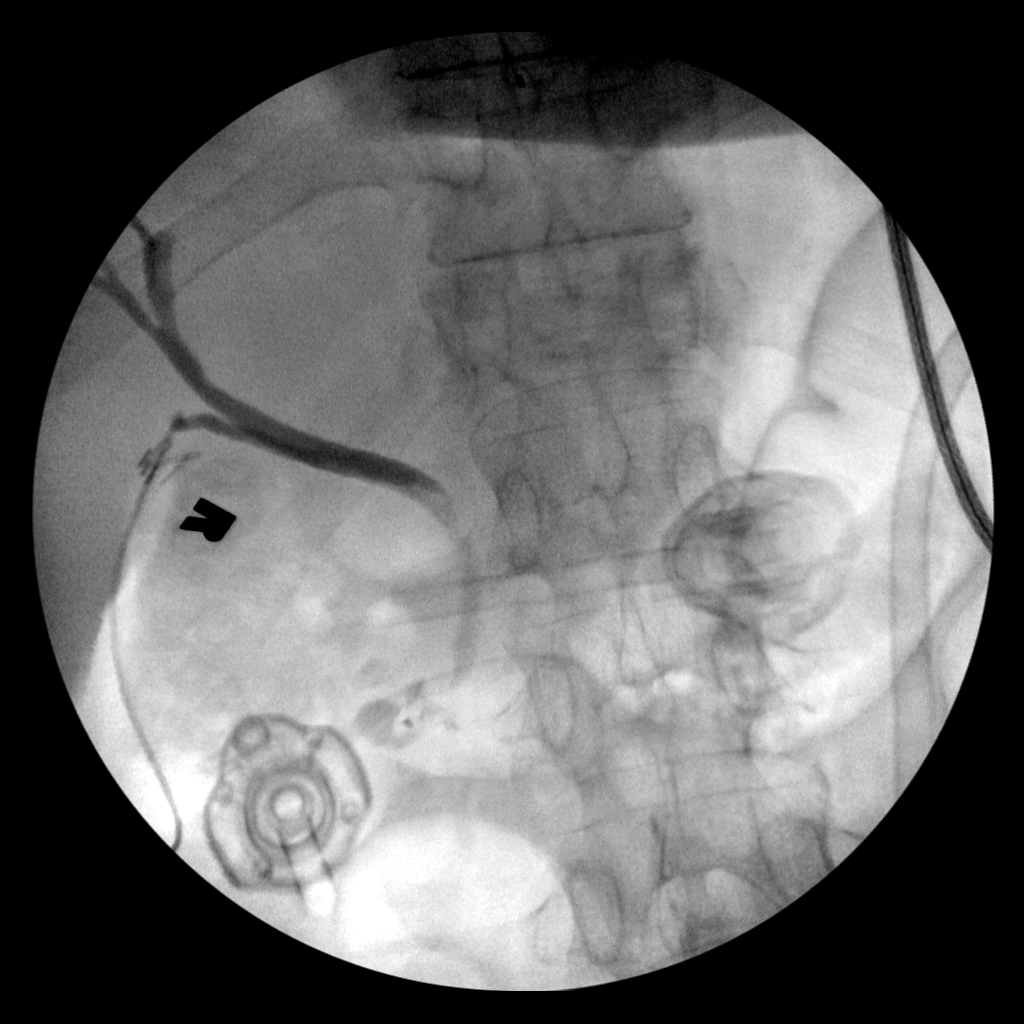
[frame 12/23]
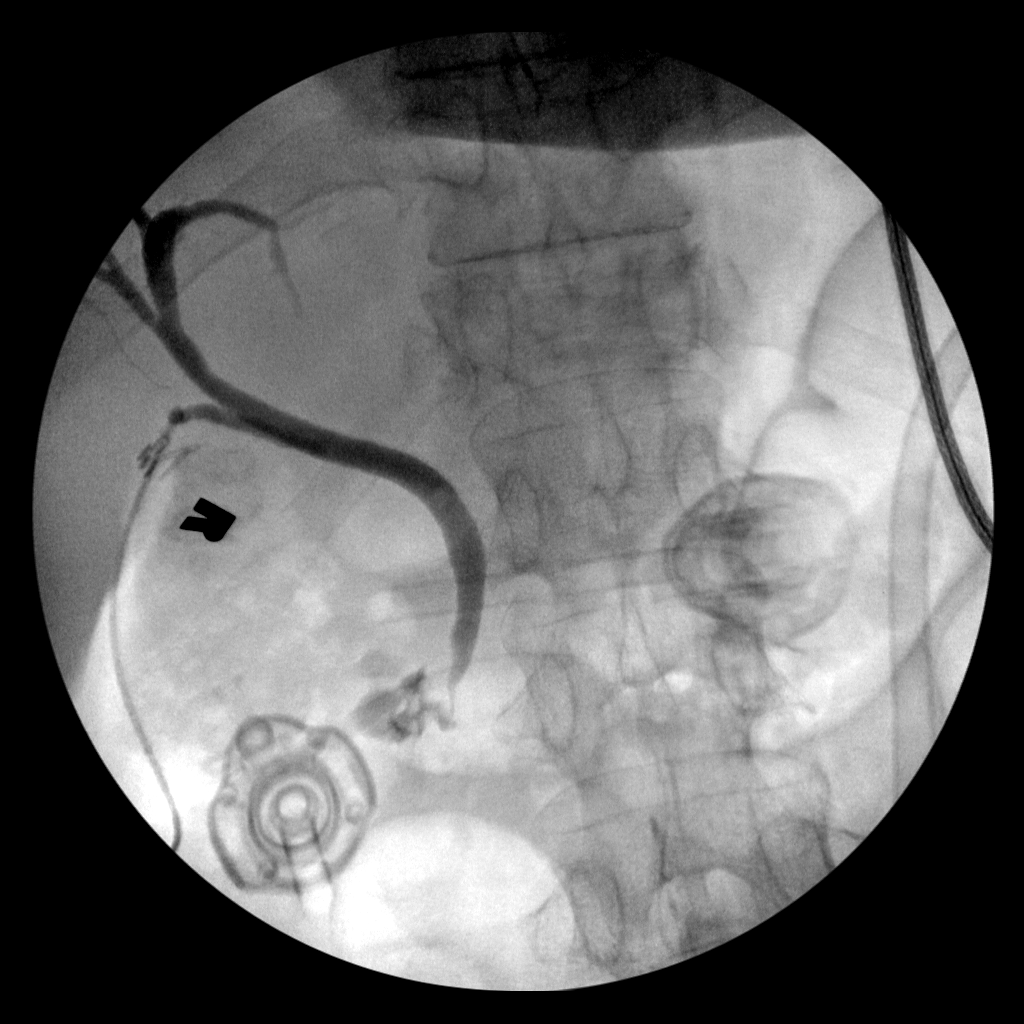
[frame 20/23]
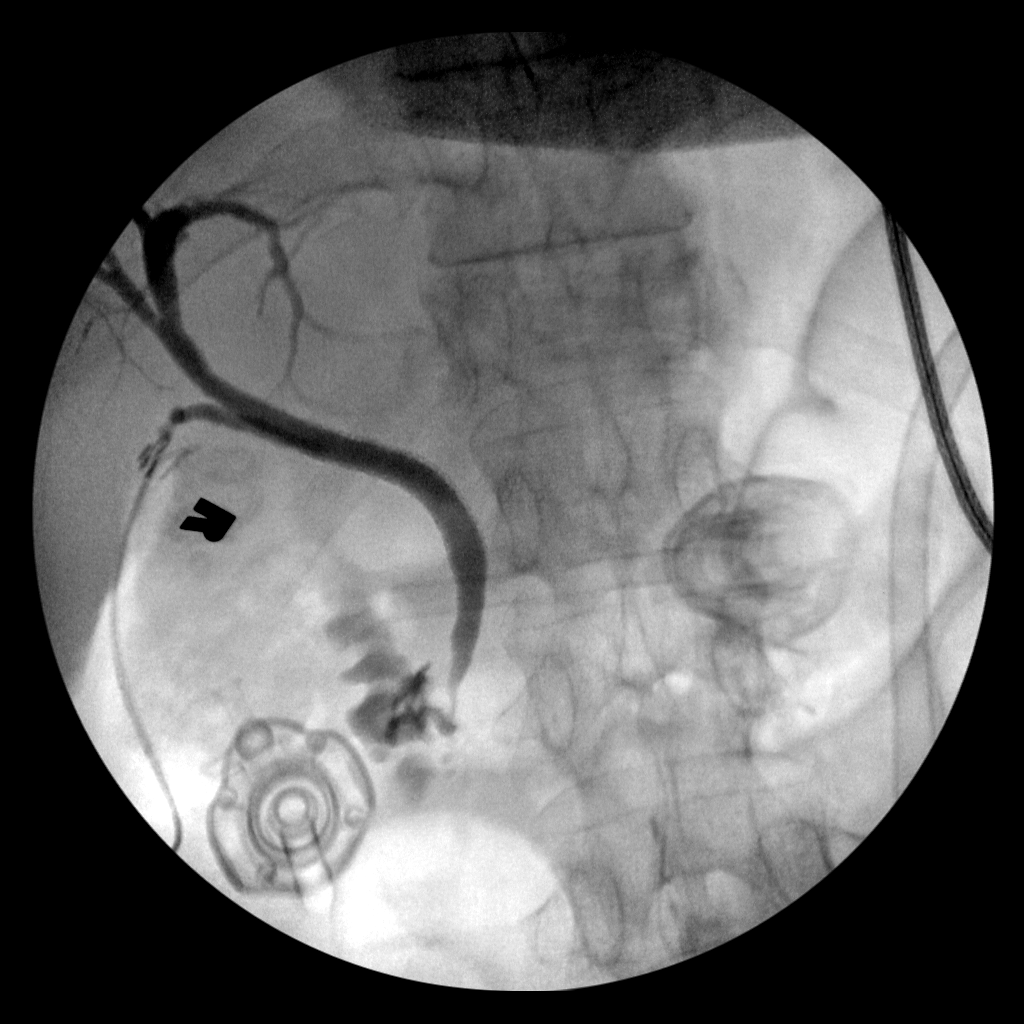
[frame 23/23]
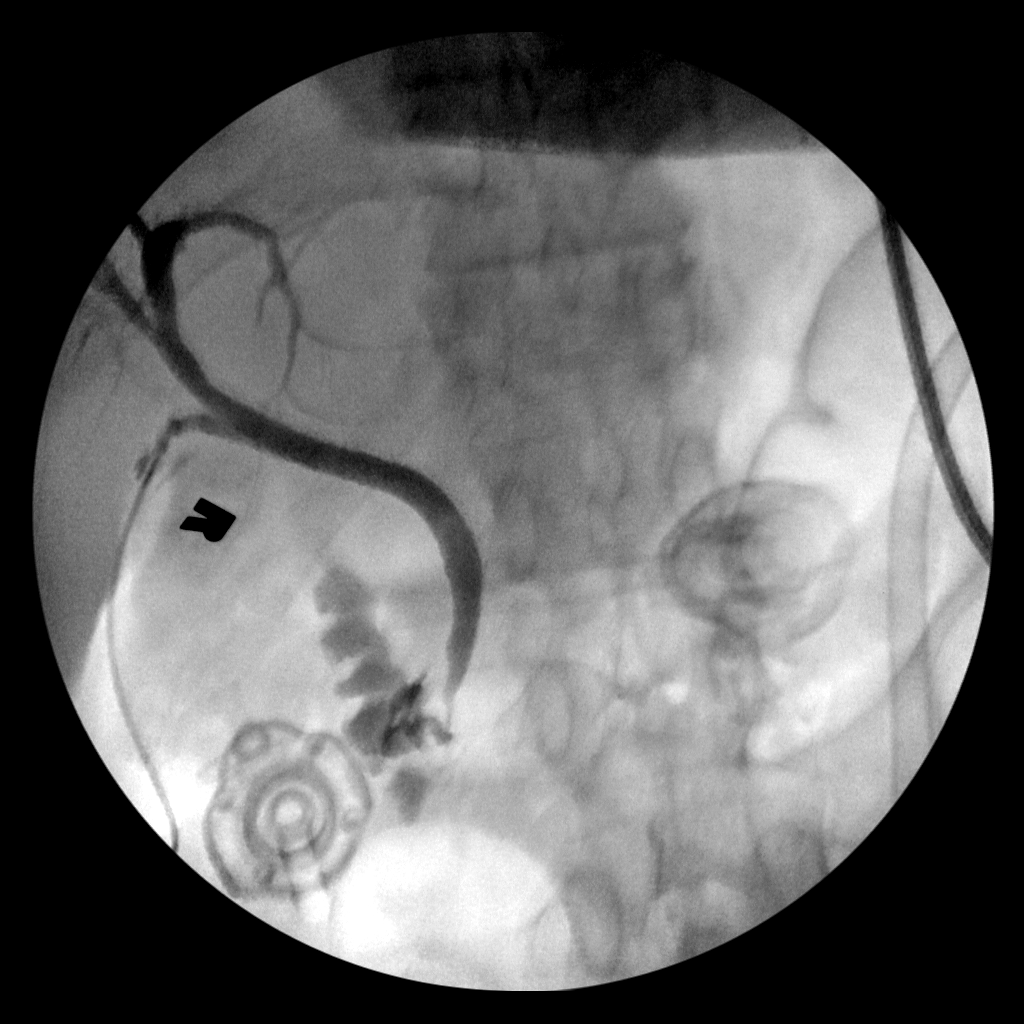

[4 of 4 positions shown; findings below may reference images not displayed]

FINDINGS: Normal intra and extrahepatic biliary caliber without
anomalies.  No filling defects, extravasation, or obstruction.
IMPRESSION: No pathological findings

## 2011-12-09 NOTE — ED Provider Notes (Signed)
Medical screening examination/treatment/procedure(s) were performed by non-physician practitioner and as supervising physician I was immediately available for consultation/collaboration.   Lyanne Co, MD 12/09/11 2041

## 2012-05-12 IMAGING — CR DG CHEST 2V
2 series · 2 of 2 positions shown · non-contrast
Comparison: 10/28/2010

CLINICAL DATA: Weakness, slurred speech, question stroke

CHEST - 2 VIEW

[x chest ap]
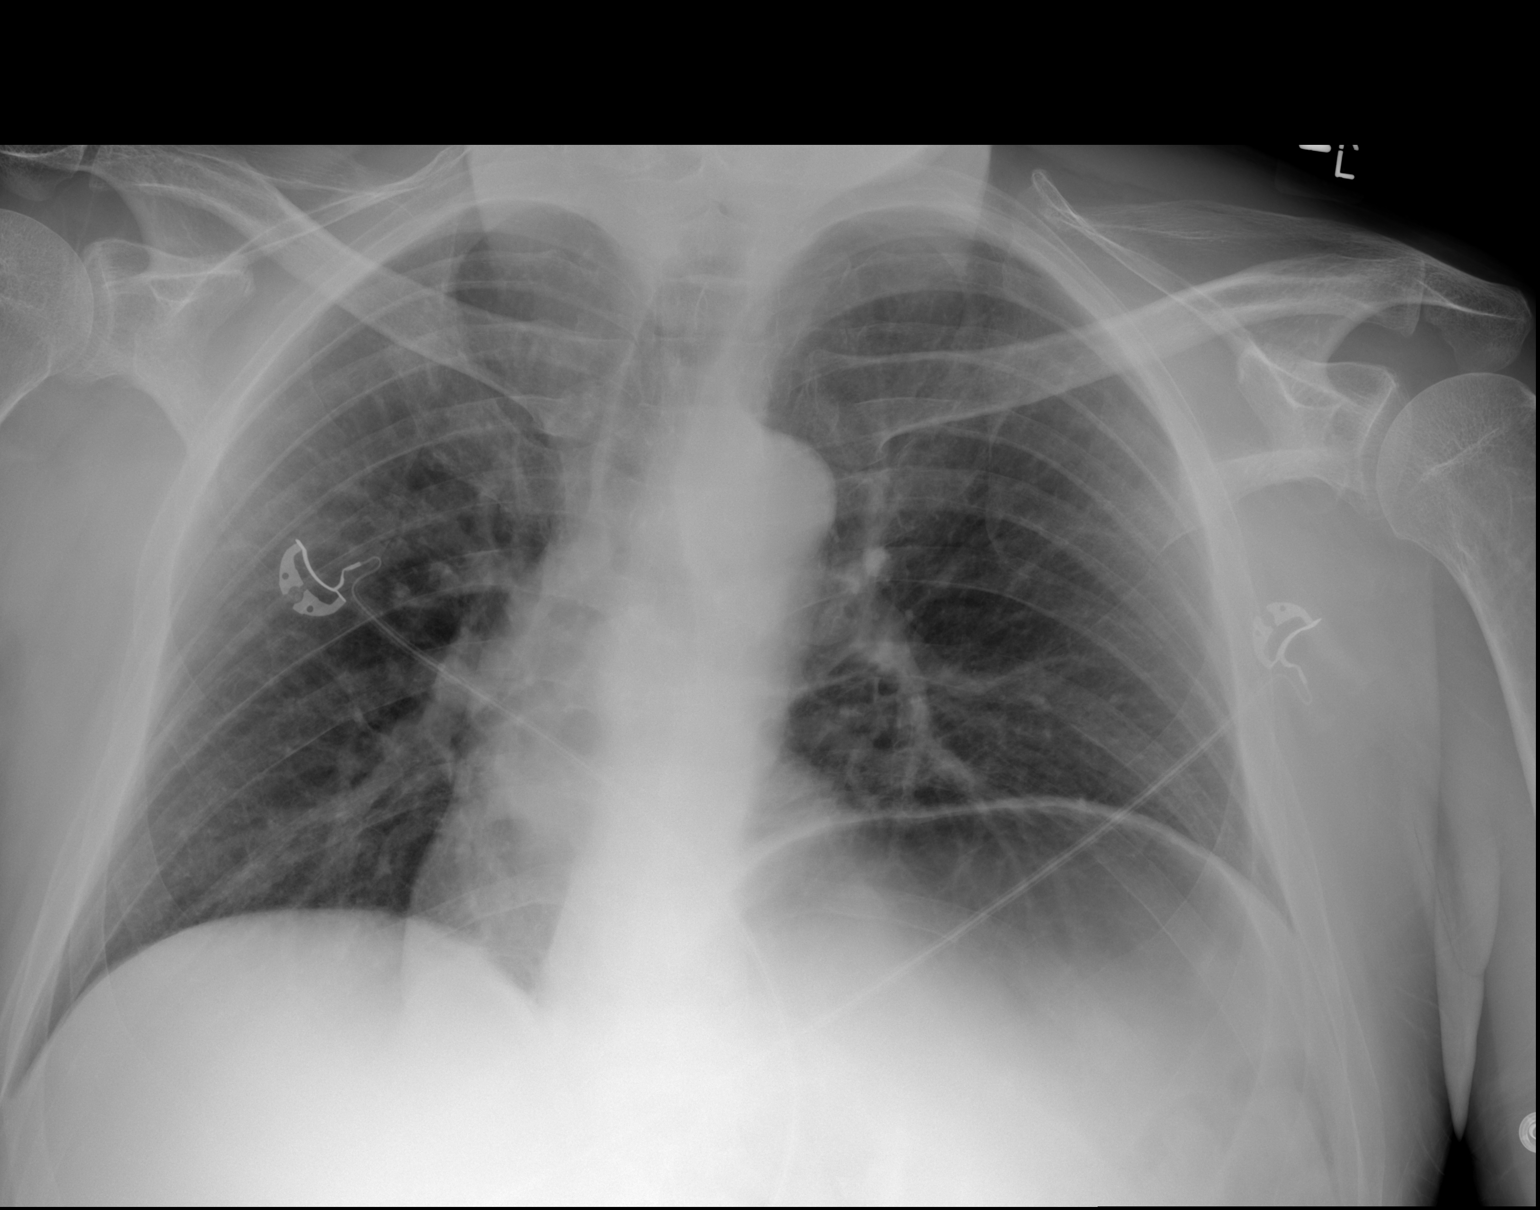

[w chest lat]
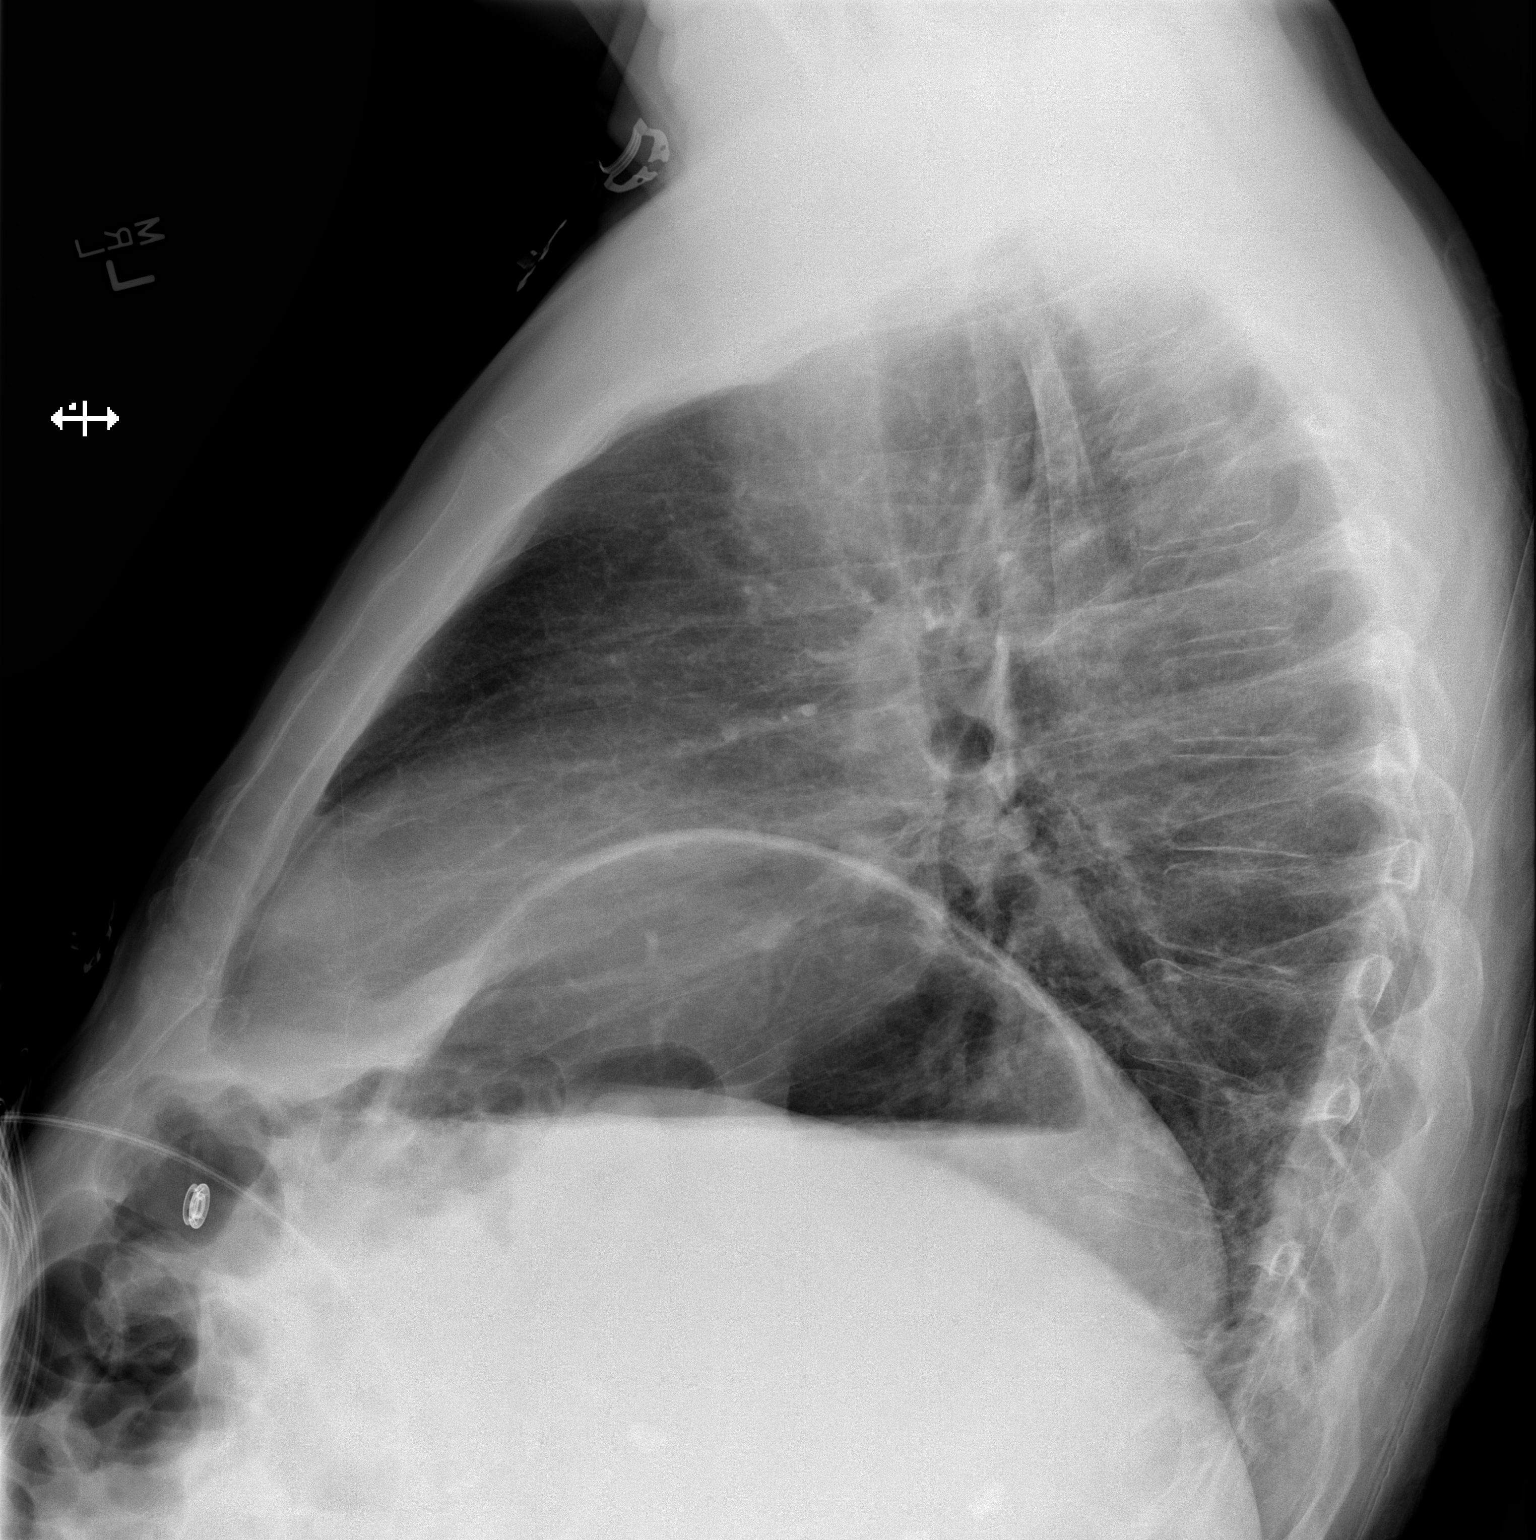

[2 of 2 positions shown; findings below may reference images not displayed]

FINDINGS: Upper-normal size of cardiac silhouette.
Mediastinal contours and pulmonary vascularity normal.
Elevation left diaphragm.
Lungs clear.
No pleural effusion or pneumothorax.
Gaseous distention of stomach.
No acute osseous findings.
IMPRESSION: Chronic elevation left diaphragm.
Gaseous distention of stomach.
No acute infiltrate.

## 2012-11-24 ENCOUNTER — Encounter: Payer: Self-pay | Admitting: Adult Health

## 2012-11-24 ENCOUNTER — Non-Acute Institutional Stay (SKILLED_NURSING_FACILITY): Payer: Medicaid Other | Admitting: Adult Health

## 2012-11-24 DIAGNOSIS — K5900 Constipation, unspecified: Secondary | ICD-10-CM

## 2012-11-24 DIAGNOSIS — Z66 Do not resuscitate: Secondary | ICD-10-CM

## 2012-11-24 DIAGNOSIS — G20 Parkinson's disease: Secondary | ICD-10-CM

## 2012-11-24 DIAGNOSIS — E559 Vitamin D deficiency, unspecified: Secondary | ICD-10-CM

## 2012-11-24 DIAGNOSIS — F411 Generalized anxiety disorder: Secondary | ICD-10-CM

## 2012-11-24 DIAGNOSIS — E119 Type 2 diabetes mellitus without complications: Secondary | ICD-10-CM

## 2012-11-24 DIAGNOSIS — F29 Unspecified psychosis not due to a substance or known physiological condition: Secondary | ICD-10-CM | POA: Insufficient documentation

## 2012-11-24 NOTE — Assessment & Plan Note (Signed)
He is slowly declining is out of bed to wheelchair; is taking sinemet 10/100 mg three times daily takes mirapex 0.35 mg three time daily

## 2012-11-24 NOTE — Assessment & Plan Note (Signed)
Is stable is taking colace twice daily  

## 2012-11-24 NOTE — Assessment & Plan Note (Signed)
Is taking vitamin d 1000 units daily

## 2012-11-24 NOTE — Assessment & Plan Note (Signed)
He is presently stable is taking novolog mix 70/30 22 units in the am and 27 units in the pm his last hgb a1c is 5.2

## 2012-11-24 NOTE — Progress Notes (Signed)
Subjective:     Patient ID: Logan Hernandez, male   DOB: 10/10/1960, 52 y.o.   MRN: [id number] Chief Complaint  Patient presents with  . Medical Managment of Chronic Issues   HPI CONSTIPATION Is stable is taking colace twice daily   DIABETES MELLITUS, TYPE II He is presently stable is taking novolog mix 70/30 22 units in the am and 27 units in the pm his last hgb a1c is 5.2   ANXIETY He is stable is being followed by facility psych services is taking effexor 75 mg daily   PARKINSON'S DISEASE He is slowly declining is out of bed to wheelchair; is taking sinemet 10/100 mg three times daily takes mirapex 0.35 mg three time daily   Psychosis His status is stable there are no reports of behavioral issues present is followed by facility psych services; is taking seroquel 25 mg in the am and 75 mg nightly   Unspecified vitamin D deficiency Is taking vitamin d 1000 units daily   no recent procedure present  Past Surgical History  Procedure Laterality Date  . Cholecystectomy     Current Outpatient Prescriptions on File Prior to Visit  Medication Sig Dispense Refill  . carbidopa-levodopa (SINEMET) 10-100 MG per tablet Take 1 tablet by mouth 3 (three) times daily.      . cholecalciferol (VITAMIN D) 1000 UNITS tablet Take 1,000 Units by mouth daily.      Marland Kitchen docusate sodium (COLACE) 100 MG capsule Take 100 mg by mouth 2 (two) times daily.      . insulin aspart protamine-insulin aspart (NOVOLOG 70/30) (70-30) 100 UNIT/ML injection Inject 22-27 Units into the skin 2 (two) times daily with a meal. Take 22 units in the morning and 27 units at night      . magnesium hydroxide (MILK OF MAGNESIA) 400 MG/5ML suspension Take 30 mLs by mouth daily as needed. For constipation      . pramipexole (MIRAPEX) 0.25 MG tablet Take 0.25 mg by mouth 3 (three) times daily.      . QUEtiapine (SEROQUEL) 25 MG tablet Take 25 mg by mouth 2 (two) times daily. Takes 25 mg in the am and 75 mg in the pm      .  venlafaxine (EFFEXOR) 75 MG tablet Take 75 mg by mouth daily.       No current facility-administered medications on file prior to visit.   Review of Systems  Unable to perform ROS      Filed Vitals:   11/24/12 1619  BP: 142/80  Pulse: 77  Height: 6\' 1"  (1.854 m)  Weight: 139 lb (63.05 kg)   Objective:   Physical Exam  Constitutional:  He is frail  Neck: Neck supple.  Cardiovascular: Normal rate, regular rhythm, normal heart sounds and intact distal pulses.   Pulmonary/Chest: Effort normal and breath sounds normal.  Abdominal: Soft. Bowel sounds are normal.  Musculoskeletal:  Is out of bed to wheelchair has rigidity present   Neurological: He is alert.  Oriented to self only  Skin: Skin is warm and dry.  Psychiatric: He has a normal mood and affect.   Lab reviewed: 08-11-12: wbc 3.0; hgb 12.1; hct 34.8; mcv 85.5.; plt 69; glucose 99; bun 16; creat 0.62; k+ 3.9 Na++ 141; t. Bili 0.6; alk phos 44; ast 8; alt 37; albumin 4.0 09-01-12: wbc 2.9; hgb 12.6; hct 36.3; mcv 84.2. Plt 66 09-20-12 hgb a1c 5.2      Assessment:     Parkinson disease; constipation; dm;  anxiety; psychosis; vit d def     Plan:   will continue his current regimen and will not make changes at this time; will continue to monitor his status and will make further changes as indicated.

## 2012-11-24 NOTE — Assessment & Plan Note (Signed)
His status is stable there are no reports of behavioral issues present is followed by facility psych services; is taking seroquel 25 mg in the am and 75 mg nightly

## 2012-11-24 NOTE — Assessment & Plan Note (Signed)
He is stable is being followed by facility psych services is taking effexor 75 mg daily

## 2012-12-02 ENCOUNTER — Emergency Department (HOSPITAL_COMMUNITY): Payer: Medicaid Other

## 2012-12-02 ENCOUNTER — Encounter (HOSPITAL_COMMUNITY): Payer: Self-pay | Admitting: Cardiology

## 2012-12-02 ENCOUNTER — Inpatient Hospital Stay (HOSPITAL_COMMUNITY)
Admission: EM | Admit: 2012-12-02 | Discharge: 2012-12-05 | DRG: 189 | Disposition: A | Discharge status: Skilled Nursing Facility | Payer: Medicaid Other | Attending: Internal Medicine | Admitting: Internal Medicine

## 2012-12-02 DIAGNOSIS — I10 Essential (primary) hypertension: Secondary | ICD-10-CM

## 2012-12-02 DIAGNOSIS — J9601 Acute respiratory failure with hypoxia: Secondary | ICD-10-CM

## 2012-12-02 DIAGNOSIS — Z794 Long term (current) use of insulin: Secondary | ICD-10-CM

## 2012-12-02 DIAGNOSIS — R945 Abnormal results of liver function studies: Secondary | ICD-10-CM

## 2012-12-02 DIAGNOSIS — E119 Type 2 diabetes mellitus without complications: Secondary | ICD-10-CM | POA: Diagnosis present

## 2012-12-02 DIAGNOSIS — E43 Unspecified severe protein-calorie malnutrition: Secondary | ICD-10-CM | POA: Diagnosis present

## 2012-12-02 DIAGNOSIS — J189 Pneumonia, unspecified organism: Secondary | ICD-10-CM

## 2012-12-02 DIAGNOSIS — D72819 Decreased white blood cell count, unspecified: Secondary | ICD-10-CM | POA: Diagnosis present

## 2012-12-02 DIAGNOSIS — G20 Parkinson's disease: Secondary | ICD-10-CM | POA: Diagnosis present

## 2012-12-02 DIAGNOSIS — E278 Other specified disorders of adrenal gland: Secondary | ICD-10-CM

## 2012-12-02 DIAGNOSIS — Q742 Other congenital malformations of lower limb(s), including pelvic girdle: Secondary | ICD-10-CM

## 2012-12-02 DIAGNOSIS — Z681 Body mass index (BMI) 19 or less, adult: Secondary | ICD-10-CM

## 2012-12-02 DIAGNOSIS — F29 Unspecified psychosis not due to a substance or known physiological condition: Secondary | ICD-10-CM

## 2012-12-02 DIAGNOSIS — F1021 Alcohol dependence, in remission: Secondary | ICD-10-CM

## 2012-12-02 DIAGNOSIS — I8500 Esophageal varices without bleeding: Secondary | ICD-10-CM

## 2012-12-02 DIAGNOSIS — B1710 Acute hepatitis C without hepatic coma: Secondary | ICD-10-CM

## 2012-12-02 DIAGNOSIS — F329 Major depressive disorder, single episode, unspecified: Secondary | ICD-10-CM

## 2012-12-02 DIAGNOSIS — D649 Anemia, unspecified: Secondary | ICD-10-CM | POA: Diagnosis present

## 2012-12-02 DIAGNOSIS — D696 Thrombocytopenia, unspecified: Secondary | ICD-10-CM

## 2012-12-02 DIAGNOSIS — K5900 Constipation, unspecified: Secondary | ICD-10-CM

## 2012-12-02 DIAGNOSIS — D376 Neoplasm of uncertain behavior of liver, gallbladder and bile ducts: Secondary | ICD-10-CM

## 2012-12-02 DIAGNOSIS — G609 Hereditary and idiopathic neuropathy, unspecified: Secondary | ICD-10-CM

## 2012-12-02 DIAGNOSIS — K766 Portal hypertension: Secondary | ICD-10-CM

## 2012-12-02 DIAGNOSIS — K7460 Unspecified cirrhosis of liver: Secondary | ICD-10-CM

## 2012-12-02 DIAGNOSIS — J9600 Acute respiratory failure, unspecified whether with hypoxia or hypercapnia: Principal | ICD-10-CM | POA: Diagnosis present

## 2012-12-02 DIAGNOSIS — Z79899 Other long term (current) drug therapy: Secondary | ICD-10-CM

## 2012-12-02 DIAGNOSIS — F411 Generalized anxiety disorder: Secondary | ICD-10-CM | POA: Diagnosis present

## 2012-12-02 DIAGNOSIS — Z66 Do not resuscitate: Secondary | ICD-10-CM | POA: Diagnosis present

## 2012-12-02 DIAGNOSIS — E785 Hyperlipidemia, unspecified: Secondary | ICD-10-CM

## 2012-12-02 DIAGNOSIS — E559 Vitamin D deficiency, unspecified: Secondary | ICD-10-CM

## 2012-12-02 DIAGNOSIS — K8020 Calculus of gallbladder without cholecystitis without obstruction: Secondary | ICD-10-CM

## 2012-12-02 DIAGNOSIS — G20A1 Parkinson's disease without dyskinesia, without mention of fluctuations: Secondary | ICD-10-CM | POA: Diagnosis present

## 2012-12-02 HISTORY — DX: Pneumonia, unspecified organism: J18.9

## 2012-12-02 HISTORY — DX: Thrombocytopenia, unspecified: D69.6

## 2012-12-02 HISTORY — DX: Acute respiratory failure with hypoxia: J96.01

## 2012-12-02 LAB — URINALYSIS, ROUTINE W REFLEX MICROSCOPIC
Glucose, UA: NEGATIVE mg/dL
Ketones, ur: 15 mg/dL — AB
Nitrite: NEGATIVE
Specific Gravity, Urine: 1.024 (ref 1.005–1.030)
pH: 5 (ref 5.0–8.0)

## 2012-12-02 LAB — COMPREHENSIVE METABOLIC PANEL WITH GFR
ALT: 38 U/L (ref 0–53)
AST: 40 U/L — ABNORMAL HIGH (ref 0–37)
Albumin: 3.9 g/dL (ref 3.5–5.2)
Alkaline Phosphatase: 65 U/L (ref 39–117)
BUN: 16 mg/dL (ref 6–23)
CO2: 26 meq/L (ref 19–32)
Calcium: 9 mg/dL (ref 8.4–10.5)
Chloride: 103 meq/L (ref 96–112)
Creatinine, Ser: 0.66 mg/dL (ref 0.50–1.35)
GFR calc Af Amer: >90 mL/min
GFR calc non Af Amer: >90 mL/min
Glucose, Bld: 107 mg/dL — ABNORMAL HIGH (ref 70–99)
Potassium: 4.7 meq/L (ref 3.5–5.1)
Sodium: 140 meq/L (ref 135–145)
Total Bilirubin: 0.8 mg/dL (ref 0.3–1.2)
Total Protein: 7.2 g/dL (ref 6.0–8.3)

## 2012-12-02 LAB — CBC WITH DIFFERENTIAL/PLATELET
Basophils Relative: 0 % (ref 0–1)
Eosinophils Absolute: 0 10*3/uL (ref 0.0–0.7)
Eosinophils Relative: 0 % (ref 0–5)
Hemoglobin: 12.9 g/dL — ABNORMAL LOW (ref 13.0–17.0)
MCH: 29.3 pg (ref 26.0–34.0)
MCHC: 35.1 g/dL (ref 30.0–36.0)
MCV: 83.6 fL (ref 78.0–100.0)
Monocytes Relative: 8 % (ref 3–12)
Neutrophils Relative %: 87 % — ABNORMAL HIGH (ref 43–77)
Platelets: 67 10*3/uL — ABNORMAL LOW (ref 150–400)

## 2012-12-02 LAB — POCT I-STAT 3, ART BLOOD GAS (G3+)
O2 Saturation: 97 %
Patient temperature: 98.6
pCO2 arterial: 31.9 mmHg — ABNORMAL LOW (ref 35.0–45.0)

## 2012-12-02 LAB — INFLUENZA PANEL BY PCR (TYPE A & B)
H1N1 flu by pcr: NOT DETECTED
Influenza A By PCR: NEGATIVE
Influenza B By PCR: NEGATIVE

## 2012-12-02 LAB — URINE MICROSCOPIC-ADD ON

## 2012-12-02 LAB — GLUCOSE, CAPILLARY

## 2012-12-02 LAB — STREP PNEUMONIAE URINARY ANTIGEN: Strep Pneumo Urinary Antigen: NEGATIVE

## 2012-12-02 LAB — POCT I-STAT TROPONIN I: Troponin i, poc: 0 ng/mL (ref 0.00–0.08)

## 2012-12-02 MED ORDER — PIPERACILLIN-TAZOBACTAM 3.375 G IVPB
3.3750 g | Freq: Once | INTRAVENOUS | Status: AC
  Administered 2012-12-02: 3.375 g via INTRAVENOUS
  Filled 2012-12-02: qty 50

## 2012-12-02 MED ORDER — ACETAMINOPHEN 650 MG RE SUPP
650.0000 mg | Freq: Once | RECTAL | Status: AC
  Administered 2012-12-02: 650 mg via RECTAL
  Filled 2012-12-02: qty 1

## 2012-12-02 MED ORDER — VITAMIN D3 25 MCG (1000 UNIT) PO TABS
1000.0000 [IU] | ORAL_TABLET | Freq: Every day | ORAL | Status: DC
  Administered 2012-12-03 – 2012-12-04 (×2): 1000 [IU] via ORAL
  Filled 2012-12-02 (×2): qty 1

## 2012-12-02 MED ORDER — CARBIDOPA-LEVODOPA 10-100 MG PO TABS
1.0000 | ORAL_TABLET | Freq: Three times a day (TID) | ORAL | Status: DC
  Administered 2012-12-03 – 2012-12-05 (×8): 1 via ORAL
  Filled 2012-12-02 (×11): qty 1

## 2012-12-02 MED ORDER — ACETAMINOPHEN 325 MG PO TABS: 650.0000 mg | ORAL_TABLET | Freq: Four times a day (QID) | ORAL | Status: DC | PRN

## 2012-12-02 MED ORDER — VENLAFAXINE HCL 75 MG PO TABS
75.0000 mg | ORAL_TABLET | Freq: Every day | ORAL | Status: DC
  Administered 2012-12-03 – 2012-12-05 (×3): 75 mg via ORAL
  Filled 2012-12-02 (×3): qty 1

## 2012-12-02 MED ORDER — PRAMIPEXOLE DIHYDROCHLORIDE 0.25 MG PO TABS
0.2500 mg | ORAL_TABLET | Freq: Three times a day (TID) | ORAL | Status: DC
  Administered 2012-12-03 – 2012-12-05 (×8): 0.25 mg via ORAL
  Filled 2012-12-02 (×11): qty 1

## 2012-12-02 MED ORDER — ONDANSETRON HCL 4 MG PO TABS: 4.0000 mg | ORAL_TABLET | Freq: Four times a day (QID) | ORAL | Status: DC | PRN

## 2012-12-02 MED ORDER — QUETIAPINE FUMARATE 50 MG PO TABS
75.0000 mg | ORAL_TABLET | Freq: Every day | ORAL | Status: DC
  Administered 2012-12-03 – 2012-12-05 (×3): 75 mg via ORAL
  Filled 2012-12-02 (×4): qty 1

## 2012-12-02 MED ORDER — LEVOFLOXACIN IN D5W 750 MG/150ML IV SOLN
750.0000 mg | INTRAVENOUS | Status: AC
  Administered 2012-12-02 – 2012-12-04 (×3): 750 mg via INTRAVENOUS
  Filled 2012-12-02 (×4): qty 150

## 2012-12-02 MED ORDER — INSULIN ASPART 100 UNIT/ML ~~LOC~~ SOLN: 3.0000 [IU] | Freq: Three times a day (TID) | SUBCUTANEOUS | Status: DC

## 2012-12-02 MED ORDER — VANCOMYCIN HCL 1000 MG IV SOLR
750.0000 mg | Freq: Three times a day (TID) | INTRAVENOUS | Status: DC
  Administered 2012-12-02 – 2012-12-04 (×5): 750 mg via INTRAVENOUS
  Filled 2012-12-02 (×8): qty 750

## 2012-12-02 MED ORDER — SODIUM CHLORIDE 0.9 % IV SOLN: 250.0000 mL | INTRAVENOUS | Status: DC | PRN

## 2012-12-02 MED ORDER — SODIUM CHLORIDE 0.9 % IJ SOLN
3.0000 mL | Freq: Two times a day (BID) | INTRAMUSCULAR | Status: DC
  Administered 2012-12-02 – 2012-12-04 (×4): 3 mL via INTRAVENOUS

## 2012-12-02 MED ORDER — DEXTROSE 5 % IV SOLN
1.0000 g | Freq: Three times a day (TID) | INTRAVENOUS | Status: DC
  Administered 2012-12-02 – 2012-12-04 (×6): 1 g via INTRAVENOUS
  Filled 2012-12-02 (×7): qty 1

## 2012-12-02 MED ORDER — INSULIN ASPART 100 UNIT/ML ~~LOC~~ SOLN: 0.0000 [IU] | Freq: Three times a day (TID) | SUBCUTANEOUS | Status: DC

## 2012-12-02 MED ORDER — VANCOMYCIN HCL IN DEXTROSE 1-5 GM/200ML-% IV SOLN
1000.0000 mg | Freq: Once | INTRAVENOUS | Status: AC
  Administered 2012-12-02: 1000 mg via INTRAVENOUS
  Filled 2012-12-02: qty 200

## 2012-12-02 MED ORDER — QUETIAPINE FUMARATE 25 MG PO TABS
25.0000 mg | ORAL_TABLET | Freq: Every day | ORAL | Status: DC
  Administered 2012-12-03 – 2012-12-05 (×3): 25 mg via ORAL
  Filled 2012-12-02 (×3): qty 1

## 2012-12-02 MED ORDER — SODIUM CHLORIDE 0.9 % IV SOLN
INTRAVENOUS | Status: DC
  Administered 2012-12-02: 11:00:00 via INTRAVENOUS

## 2012-12-02 MED ORDER — ACETAMINOPHEN 650 MG RE SUPP: 650.0000 mg | Freq: Four times a day (QID) | RECTAL | Status: DC | PRN

## 2012-12-02 MED ORDER — DOCUSATE SODIUM 100 MG PO CAPS
100.0000 mg | ORAL_CAPSULE | Freq: Two times a day (BID) | ORAL | Status: DC
  Administered 2012-12-03 – 2012-12-05 (×5): 100 mg via ORAL
  Filled 2012-12-02 (×6): qty 1

## 2012-12-02 MED ORDER — OXYCODONE HCL 5 MG PO TABS: 5.0000 mg | ORAL_TABLET | ORAL | Status: DC | PRN

## 2012-12-02 MED ORDER — SODIUM CHLORIDE 0.9 % IJ SOLN: 3.0000 mL | INTRAMUSCULAR | Status: DC | PRN

## 2012-12-02 MED ORDER — ONDANSETRON HCL 4 MG/2ML IJ SOLN: 4.0000 mg | Freq: Four times a day (QID) | INTRAMUSCULAR | Status: DC | PRN

## 2012-12-02 MED ORDER — ENOXAPARIN SODIUM 40 MG/0.4ML ~~LOC~~ SOLN
40.0000 mg | SUBCUTANEOUS | Status: DC
  Administered 2012-12-02: 40 mg via SUBCUTANEOUS
  Filled 2012-12-02 (×2): qty 0.4

## 2012-12-02 NOTE — ED Notes (Signed)
Pt to department via EMS from [facility]- pt was at breakfast this morning and was fine but came back to check on pt on he had some respiratory distress. Pt was placed on NRB by EMS because 02 sats were in the 70's. HR-120 RR-40 Bp-100/70. 18g left forearm.

## 2012-12-02 NOTE — ED Provider Notes (Signed)
History     CSN: [id number]  Arrival date & time 12/02/12  1030   First MD Initiated Contact with Patient 12/02/12 1035      Chief Complaint  Patient presents with  . Respiratory Distress    (Consider location/radiation/quality/duration/timing/severity/associated sxs/prior treatment) The history is provided by the EMS personnel, the nursing home and the patient. The history is limited by the condition of the patient.   patient here straight distress from the nursing home which began after breakfast today. EMS was called and patient's O2 sats were in the 70s. Patient has a history of Parkinson's and is a DO NOT RESUSCITATE. EMS gave the patient oxygen and transported him here. Symptoms have been persistent and somewhat improved with supplemental oxygen. No treatment used prior to arrival.  Past Medical History  Diagnosis Date  . Parkinson disease   . Diabetes mellitus   . Ulcer   . Anxiety     Past Surgical History  Procedure Laterality Date  . Cholecystectomy      History reviewed. No pertinent family history.  History  Substance Use Topics  . Smoking status: Never Smoker   . Smokeless tobacco: Never Used  . Alcohol Use: No      Review of Systems  Unable to perform ROS   Allergies  Review of patient's allergies indicates no known allergies.  Home Medications   Current Outpatient Rx  Name  Route  Sig  Dispense  Refill  . carbidopa-levodopa (SINEMET) 10-100 MG per tablet   Oral   Take 1 tablet by mouth 3 (three) times daily.         . cholecalciferol (VITAMIN D) 1000 UNITS tablet   Oral   Take 1,000 Units by mouth daily.         Marland Kitchen docusate sodium (COLACE) 100 MG capsule   Oral   Take 100 mg by mouth 2 (two) times daily.         . insulin aspart protamine-insulin aspart (NOVOLOG 70/30) (70-30) 100 UNIT/ML injection   Subcutaneous   Inject 22-27 Units into the skin 2 (two) times daily with a meal. Take 22 units in the morning and 27 units at  night         . magnesium hydroxide (MILK OF MAGNESIA) 400 MG/5ML suspension   Oral   Take 30 mLs by mouth daily as needed. For constipation         . pramipexole (MIRAPEX) 0.25 MG tablet   Oral   Take 0.25 mg by mouth 3 (three) times daily.         . QUEtiapine (SEROQUEL) 25 MG tablet   Oral   Take 25 mg by mouth 2 (two) times daily. Takes 25 mg in the am and 75 mg in the pm         . venlafaxine (EFFEXOR) 75 MG tablet   Oral   Take 75 mg by mouth daily.           There were no vitals taken for this visit.  Physical Exam  Nursing note and vitals reviewed. Constitutional: He appears lethargic. He appears cachectic. He appears toxic. He has a sickly appearance. He appears ill. He appears distressed. Face mask in place.  HENT:  Head: Normocephalic and atraumatic.  Eyes: Conjunctivae, EOM and lids are normal. Pupils are equal, round, and reactive to light.  Neck: Normal range of motion. Neck supple. No tracheal deviation present. No mass present.  Cardiovascular: Regular rhythm and normal heart sounds.  Tachycardia present.  Exam reveals no gallop.   No murmur heard. Pulmonary/Chest: Effort normal. No stridor. No respiratory distress. He has decreased breath sounds. He has no wheezes. He has rhonchi. He has no rales.  Abdominal: Soft. Normal appearance and bowel sounds are normal. He exhibits no distension. There is no tenderness. There is no rebound and no CVA tenderness.  Musculoskeletal: Normal range of motion. He exhibits no edema and no tenderness.  Neurological: He appears lethargic. GCS eye subscore is 4. GCS verbal subscore is 3. GCS motor subscore is 3.  Pt moves all 4 ext  Skin: Skin is warm and dry. No abrasion and no rash noted.  Psychiatric: His behavior is normal. His affect is inappropriate. His speech is delayed.    ED Course  Procedures (including critical care time)  Labs Reviewed  URINE CULTURE  CULTURE, BLOOD (ROUTINE X 2)  CULTURE, BLOOD  (ROUTINE X 2)  CBC WITH DIFFERENTIAL  COMPREHENSIVE METABOLIC PANEL  URINALYSIS, ROUTINE W REFLEX MICROSCOPIC   No results found.   No diagnosis found.    MDM   Date: 12/02/2012  Rate: 114  Rhythm: sinus tachycardia  QRS Axis: right  Intervals: normal  ST/T Wave abnormalities: nonspecific ST changes  Conduction Disutrbances:none  Narrative Interpretation:   Old EKG Reviewed: none available  12:46 PM Patient placed on BiPAP due to his respiratory distress. He does have a valid DO NOT RESUSCITATE form. Reassessed multiple times his respirations are improving. Suspect that he has healthcare associated pneumonia and he has been placed on vancomycin and Zosyn with blood cultures being drawn prior to the initiation of antibiotics. He'll be admitted to the step down unit. Respiratory rate remains elevated but he does appear to be more couple. Pulse oximetry is more stable. Patient's temperature will be treated with Tylenol   CRITICAL CARE Performed by: Toy Baker   Total critical care time: 60  Critical care time was exclusive of separately billable procedures and treating other patients.  Critical care was necessary to treat or prevent imminent or life-threatening deterioration.  Critical care was time spent personally by me on the following activities: development of treatment plan with patient and/or surrogate as well as nursing, discussions with consultants, evaluation of patient's response to treatment, examination of patient, obtaining history from patient or surrogate, ordering and performing treatments and interventions, ordering and review of laboratory studies, ordering and review of radiographic studies, pulse oximetry and re-evaluation of patient's condition.           Toy Baker, MD 12/02/12 1247

## 2012-12-02 NOTE — Progress Notes (Signed)
Pt. seen/evaluated switched to CPAP- 6 breathing on own RR-35 with great volumes >900cc's, decreased fi02 down to 40%/100% sats more alert than upon arrival, RN @ bedside aware, RT to monitor.

## 2012-12-02 NOTE — Progress Notes (Signed)
ANTIBIOTIC CONSULT NOTE - INITIAL  Pharmacy Consult for Vanco x 8 days Indication: rule out pneumonia  No Known Allergies  Patient Measurements:   Adjusted Body Weight:    Vital Signs: Temp: 100.9 F (38.3 C) (03/29 1531) Temp src: Core (Comment) (03/29 1531) BP: 105/77 mmHg (03/29 1531) Pulse Rate: 88 (03/29 1531) Intake/Output from previous day:   Intake/Output from this shift:    Labs:  Recent Labs  12/02/12 1050  WBC 5.2  HGB 12.9*  PLT 67*  CREATININE 0.66   The CrCl is unknown because both a height and weight (above a minimum accepted value) are required for this calculation. No results found for this basename: VANCOTROUGH, VANCOPEAK, VANCORANDOM, GENTTROUGH, GENTPEAK, GENTRANDOM, TOBRATROUGH, TOBRAPEAK, TOBRARND, AMIKACINPEAK, AMIKACINTROU, AMIKACIN,  in the last 72 hours   Microbiology: No results found for this or any previous visit (from the past 720 hour(s)).  Medical History: Past Medical History  Diagnosis Date  . Parkinson disease   . Diabetes mellitus   . Ulcer   . Anxiety     Assessment: 52 y/o M from SNF became SOB after breakfast. Initiate abx for HCAP vs aspiration.   PMH: DM, Parkinson's dz, thrombocytopenia,   Tmax 101.6, WBC 5.2, Scr 0.66, CrCl 96.  Goal of Therapy:  Vancomycin trough level 15-20 mcg/ml  Plan:  Levaquin 750mg  IV q24h dose ok. Cefepime 1g IV q8h dose ok Vanco 1g IV x 1 then 750mg  IV q8hrs. Trough after 3-5 doses at [facility].  Misty Stanley Stillinger 12/02/2012,4:41 PM

## 2012-12-02 NOTE — H&P (Addendum)
[facility]          History and Physical    PCP:   Terald Sleeper, MD   Chief Complaint:  Respiratory Distress.  HPI: 52 y/o man from SNF. Cannot obtain any history as he is currently on NIPPV. Only contact number in chart is for patient's father and I have not received a response. All history is per EDP. Apparently patient was fine at SNF had breakfast and then started becoming SOB. Was transferred to the ED for further evaluation. Has a history of advanced Parkinson's Disease. He is a DNR.  Allergies:  No Known Allergies    Past Medical History  Diagnosis Date  . Parkinson disease   . Diabetes mellitus   . Ulcer   . Anxiety     Past Surgical History  Procedure Laterality Date  . Cholecystectomy      Prior to Admission medications   Medication Sig Start Date End Date Taking? Authorizing Provider  carbidopa-levodopa (SINEMET) 10-100 MG per tablet Take 1 tablet by mouth 3 (three) times daily.    Historical Provider, MD  cholecalciferol (VITAMIN D) 1000 UNITS tablet Take 1,000 Units by mouth daily.    Historical Provider, MD  docusate sodium (COLACE) 100 MG capsule Take 100 mg by mouth 2 (two) times daily.    Historical Provider, MD  insulin aspart protamine-insulin aspart (NOVOLOG 70/30) (70-30) 100 UNIT/ML injection Inject 22-27 Units into the skin 2 (two) times daily with a meal. Take 22 units in the morning and 27 units at night    Historical Provider, MD  magnesium hydroxide (MILK OF MAGNESIA) 400 MG/5ML suspension Take 30 mLs by mouth daily as needed. For constipation    Historical Provider, MD  pramipexole (MIRAPEX) 0.25 MG tablet Take 0.25 mg by mouth 3 (three) times daily.    Historical Provider, MD  QUEtiapine (SEROQUEL) 25 MG tablet Take 25 mg by mouth 2 (two) times daily. Takes 25 mg in the am and 75 mg in the pm    Historical Provider, MD  venlafaxine (EFFEXOR) 75 MG tablet Take 75 mg by mouth daily.    Historical Provider, MD    Social  History:  reports that he has never smoked. He has never used smokeless tobacco. He reports that he does not drink alcohol. His drug history is not on file.  History reviewed. No pertinent family history.  Review of Systems:  Unable to obatain.  Physical Exam: Blood pressure 106/89, pulse 108, temperature 100.8 F (38.2 C), temperature source Core (Comment), resp. rate 27, SpO2 100.00%. Gen: BiPap on, not very responsive. HEENT: [place]/AT Neck: supple, no JVD, no LAD, no bruits, no goiter CV: RRR no M/R/G Lungs: bibasilar crackles Abd: S/NT/ND/+BS/no masses or organomegaly noted. Ext: no C/C/E  Labs on Admission:  Results for orders placed during the hospital encounter of 12/02/12 (from the past 48 hour(s))  CBC WITH DIFFERENTIAL     Status: Abnormal   Collection Time    12/02/12 10:50 AM      Result Value Range   WBC 5.2  4.0 - 10.5 K/uL   RBC 4.40  4.22 - 5.81 MIL/uL   Hemoglobin 12.9 (*) 13.0 - 17.0 g/dL   HCT 16.1 (*) 09.6 - 04.5 %   MCV 83.6  78.0 - 100.0 fL   MCH 29.3  26.0 - 34.0 pg   MCHC 35.1  30.0 - 36.0 g/dL   RDW 40.9  81.1 - 91.4 %   Platelets 67 (*) 150 - 400  K/uL   Comment: CONSISTENT WITH PREVIOUS RESULT   Neutrophils Relative 87 (*) 43 - 77 %   Neutro Abs 4.5  1.7 - 7.7 K/uL   Lymphocytes Relative 5 (*) 12 - 46 %   Lymphs Abs 0.3 (*) 0.7 - 4.0 K/uL   Monocytes Relative 8  3 - 12 %   Monocytes Absolute 0.4  0.1 - 1.0 K/uL   Eosinophils Relative 0  0 - 5 %   Eosinophils Absolute 0.0  0.0 - 0.7 K/uL   Basophils Relative 0  0 - 1 %   Basophils Absolute 0.0  0.0 - 0.1 K/uL  COMPREHENSIVE METABOLIC PANEL     Status: Abnormal   Collection Time    12/02/12 10:50 AM      Result Value Range   Sodium 140  135 - 145 mEq/L   Potassium 4.7  3.5 - 5.1 mEq/L   Chloride 103  96 - 112 mEq/L   CO2 26  19 - 32 mEq/L   Glucose, Bld 107 (*) 70 - 99 mg/dL   BUN 16  6 - 23 mg/dL   Creatinine, Ser 1.61  0.50 - 1.35 mg/dL   Calcium 9.0  8.4 - 09.6 mg/dL   Total Protein 7.2   6.0 - 8.3 g/dL   Albumin 3.9  3.5 - 5.2 g/dL   AST 40 (*) 0 - 37 U/L   ALT 38  0 - 53 U/L   Alkaline Phosphatase 65  39 - 117 U/L   Total Bilirubin 0.8  0.3 - 1.2 mg/dL   GFR calc non Af Amer >90  >90 mL/min   GFR calc Af Amer >90  >90 mL/min   Comment:            The eGFR has been calculated     using the CKD EPI equation.     This calculation has not been     validated in all clinical     situations.     eGFR's persistently     <90 mL/min signify     possible Chronic Kidney Disease.  URINALYSIS, ROUTINE W REFLEX MICROSCOPIC     Status: Abnormal   Collection Time    12/02/12 10:53 AM      Result Value Range   Color, Urine ORANGE (*) YELLOW   Comment: BIOCHEMICALS MAY BE AFFECTED BY COLOR   APPearance TURBID (*) CLEAR   Specific Gravity, Urine 1.024  1.005 - 1.030   pH 5.0  5.0 - 8.0   Glucose, UA NEGATIVE  NEGATIVE mg/dL   Hgb urine dipstick LARGE (*) NEGATIVE   Bilirubin Urine MODERATE (*) NEGATIVE   Ketones, ur 15 (*) NEGATIVE mg/dL   Protein, ur 30 (*) NEGATIVE mg/dL   Urobilinogen, UA 2.0 (*) 0.0 - 1.0 mg/dL   Nitrite NEGATIVE  NEGATIVE   Leukocytes, UA SMALL (*) NEGATIVE  URINE MICROSCOPIC-ADD ON     Status: Abnormal   Collection Time    12/02/12 10:53 AM      Result Value Range   Squamous Epithelial / LPF RARE  RARE   WBC, UA 3-6  <3 WBC/hpf   RBC / HPF 21-50  <3 RBC/hpf   Crystals CA OXALATE CRYSTALS (*) NEGATIVE   Urine-Other AMORPHOUS URATES/PHOSPHATES    POCT I-STAT TROPONIN I     Status: None   Collection Time    12/02/12 11:10 AM      Result Value Range   Troponin i, poc 0.00  0.00 - 0.08  ng/mL   Comment 3            Comment: Due to the release kinetics of cTnI,     a negative result within the first hours     of the onset of symptoms does not rule out     myocardial infarction with certainty.     If myocardial infarction is still suspected,     repeat the test at appropriate intervals.  CG4 I-STAT (LACTIC ACID)     Status: None   Collection Time     12/02/12 11:12 AM      Result Value Range   Lactic Acid, Venous 2.00  0.5 - 2.2 mmol/L    Radiological Exams on Admission: Dg Chest Portable 1 View  12/02/2012  *RADIOLOGY REPORT*  Clinical Data: Respiratory distress.  PORTABLE CHEST - 1 VIEW  Comparison: 12/05/2011  Findings: Portable views of the chest were obtained.  There are peribronchial and interstitial densities at the lung bases, left side greater than right.  Upper lungs are clear.  No focal airspace disease.  Heart size is normal.  No evidence for a pneumothorax.  IMPRESSION: Mild peribronchial and interstitial densities at the lung bases. Findings could represent chronic changes and atelectasis.  No focal airspace disease.   Original Report Authenticated By: Richarda Overlie, M.D.     Assessment/Plan Principal Problem:   Acute respiratory failure with hypoxia Active Problems:   DIABETES MELLITUS, TYPE II   PARKINSON'S DISEASE   HCAP (healthcare-associated pneumonia)   Thrombocytopenia    Acute Respiratory Failure with Hypoxia -2/2 HCAP vs aspiration event. -Currently on NIPPV. -Will try to wean off. -Unable to contact family but patient has a DNR. -See below.  HCAP vs Aspiration PNA -Likely represents an aspiration event as this happened immediately following eating and CXR does not show a clear infiltrate. -Temp of 101.6 in the ED. -Will cover with vanc/cefepime/levaquin. -Sputum/blood cx ordered. -Legionella/strep pneumo urine antigens ordered. -Will need to continue attempts at contacting family to further define scope of treatment. -For now will admit to SDU on BiPap. -Check ABG.  IDDM -Hold 70/30 for now. -SSI only for now.  Parkinson's Disease -Advanced per chart review. -Continue PO meds once more alert.  Thrombocytopenia -67. -Is chronic and at baseline. -?etiology  DVT Prophylaxis -Lovenox.    Time Spent on Admission: 75 minutes.  Chaya Jan [facility] Pager:  [phone] 12/02/2012, 2:19 PM

## 2012-12-03 DIAGNOSIS — E119 Type 2 diabetes mellitus without complications: Secondary | ICD-10-CM

## 2012-12-03 DIAGNOSIS — D696 Thrombocytopenia, unspecified: Secondary | ICD-10-CM

## 2012-12-03 LAB — LEGIONELLA ANTIGEN, URINE

## 2012-12-03 LAB — CBC
MCHC: 35.4 g/dL (ref 30.0–36.0)
Platelets: 59 10*3/uL — ABNORMAL LOW (ref 150–400)
RDW: 13.5 % (ref 11.5–15.5)
WBC: 4.4 10*3/uL (ref 4.0–10.5)

## 2012-12-03 LAB — BASIC METABOLIC PANEL
Calcium: 8.9 mg/dL (ref 8.4–10.5)
Chloride: 103 mEq/L (ref 96–112)
Creatinine, Ser: 0.51 mg/dL (ref 0.50–1.35)
GFR calc Af Amer: >90 mL/min (ref >90)
GFR calc non Af Amer: >90 mL/min (ref >90)

## 2012-12-03 LAB — URINE CULTURE: Colony Count: NO GROWTH

## 2012-12-03 LAB — GLUCOSE, CAPILLARY
Glucose-Capillary: 103 mg/dL — ABNORMAL HIGH (ref 70–99)
Glucose-Capillary: 110 mg/dL — ABNORMAL HIGH (ref 70–99)

## 2012-12-03 LAB — HIV ANTIBODY (ROUTINE TESTING W REFLEX): HIV: NONREACTIVE

## 2012-12-03 NOTE — Progress Notes (Signed)
Utilization review completed.  

## 2012-12-03 NOTE — Progress Notes (Addendum)
[facility] Progress Note [place] TEAM 1 - Stepdown/ICU TEAM   Logan Hernandez QIO:[id number] DOB: 10-Nov-1960 DOA: 12/02/2012 PCP: Terald Sleeper, MD  Brief narrative: 52 y/o man from SNF. Cannot obtain any history as he is currently on NIPPV. Only contact number in chart is for patient's father and I have not received a response. All history is per EDP. Apparently patient was fine at SNF had breakfast and then started becoming SOB. Was transferred to the ED for further evaluation. Has a history of advanced Parkinson's Disease. He is a DNR.   Assessment/Plan: Principal Problem:   Acute respiratory failure with hypoxia/  HCAP (healthcare-associated pneumonia) - possible aspiration? - cont current antibiotics - significantly improved  Active Problems:   DIABETES MELLITUS, TYPE II - cont current treatment    PARKINSON'S DISEASE -stable on current medication    Thrombocytopenia - follow- no baseline available   Code Status: DNR Family Communication: none Disposition Plan: follow in SDU for tonight-   Consultants: none  Procedures: none  Antibiotics: Vanc Cefepime Levaquin  DVT prophylaxis: SCDs  HPI/Subjective: Patient alert- communicative with short answers- no dyspea or cough   Objective: Blood pressure 108/67, pulse 71, temperature 98.2 F (36.8 C), temperature source Oral, resp. rate 19, height 6' 0.84" (1.85 m), weight 63 kg (138 lb 14.2 oz), SpO2 98.00%.  Intake/Output Summary (Last 24 hours) at 12/03/12 1949 Last data filed at 12/03/12 1800  Gross per 24 hour  Intake   1593 ml  Output   1925 ml  Net   -332 ml     Exam: General: No acute respiratory distress Lungs: Clear to auscultation bilaterally without wheezes or crackles Cardiovascular: Regular rate and rhythm without murmur gallop or rub normal S1 and S2 Abdomen: Nontender, nondistended, soft, bowel sounds positive, no rebound, no ascites, no appreciable mass Extremities: No  significant cyanosis, clubbing, or edema bilateral lower extremities  Data Reviewed: Basic Metabolic Panel:  Recent Labs Lab 12/02/12 1050 12/03/12 0425  NA 140 134*  K 4.7 4.6  CL 103 103  CO2 26 24  GLUCOSE 107* 94  BUN 16 12  CREATININE 0.66 0.51  CALCIUM 9.0 8.9   Liver Function Tests:  Recent Labs Lab 12/02/12 1050  AST 40*  ALT 38  ALKPHOS 65  BILITOT 0.8  PROT 7.2  ALBUMIN 3.9   No results found for this basename: LIPASE, AMYLASE,  in the last 168 hours No results found for this basename: AMMONIA,  in the last 168 hours CBC:  Recent Labs Lab 12/02/12 1050 12/03/12 0425  WBC 5.2 4.4  NEUTROABS 4.5  --   HGB 12.9* 12.2*  HCT 36.8* 34.5*  MCV 83.6 86.0  PLT 67* 59*   Cardiac Enzymes: No results found for this basename: CKTOTAL, CKMB, CKMBINDEX, TROPONINI,  in the last 168 hours BNP (last 3 results) No results found for this basename: PROBNP,  in the last 8760 hours CBG:  Recent Labs Lab 12/02/12 1729 12/02/12 2045 12/03/12 0719 12/03/12 1713  GLUCAP 109* 140* 103* 113*    Recent Results (from the past 240 hour(s))  CULTURE, BLOOD (ROUTINE X 2)     Status: None   Collection Time    12/02/12 10:50 AM      Result Value Range Status   Specimen Description BLOOD RIGHT ARM   Final   Special Requests BOTTLES DRAWN AEROBIC AND ANAEROBIC 10CC   Final   Culture  Setup Time 12/02/2012 18:02   Final   Culture  Final   Value:        BLOOD CULTURE RECEIVED NO GROWTH TO DATE CULTURE WILL BE HELD FOR 5 DAYS BEFORE ISSUING A FINAL NEGATIVE REPORT   Report Status PENDING   Incomplete  URINE CULTURE     Status: None   Collection Time    12/02/12 10:53 AM      Result Value Range Status   Specimen Description URINE, CATHETERIZED   Final   Special Requests NONE   Final   Culture  Setup Time 12/02/2012 17:35   Final   Colony Count NO GROWTH   Final   Culture NO GROWTH   Final   Report Status 12/03/2012 FINAL   Final  CULTURE, BLOOD (ROUTINE X 2)      Status: None   Collection Time    12/02/12 10:55 AM      Result Value Range Status   Specimen Description BLOOD RIGHT ARM   Final   Special Requests BOTTLES DRAWN AEROBIC AND ANAEROBIC 10CC   Final   Culture  Setup Time 12/02/2012 18:02   Final   Culture     Final   Value:        BLOOD CULTURE RECEIVED NO GROWTH TO DATE CULTURE WILL BE HELD FOR 5 DAYS BEFORE ISSUING A FINAL NEGATIVE REPORT   Report Status PENDING   Incomplete  MRSA PCR SCREENING     Status: None   Collection Time    12/02/12  4:03 PM      Result Value Range Status   MRSA by PCR NEGATIVE  NEGATIVE Final   Comment:            The GeneXpert MRSA Assay (FDA     approved for NASAL specimens     only), is one component of a     comprehensive MRSA colonization     surveillance program. It is not     intended to diagnose MRSA     infection nor to guide or     monitor treatment for     MRSA infections.     Studies:  Recent x-ray studies have been reviewed in detail by the Attending Physician  Scheduled Meds:  Scheduled Meds: . carbidopa-levodopa  1 tablet Oral TID  . ceFEPime (MAXIPIME) IV  1 g Intravenous Q8H  . cholecalciferol  1,000 Units Oral Daily  . docusate sodium  100 mg Oral BID  . enoxaparin (LOVENOX) injection  40 mg Subcutaneous Q24H  . insulin aspart  0-9 Units Subcutaneous TID WC  . insulin aspart  3 Units Subcutaneous TID WC  . levofloxacin (LEVAQUIN) IV  750 mg Intravenous Q24H  . pramipexole  0.25 mg Oral TID  . QUEtiapine  25 mg Oral Daily  . QUEtiapine  75 mg Oral QHS  . sodium chloride  3 mL Intravenous Q12H  . vancomycin  750 mg Intravenous Q8H  . venlafaxine  75 mg Oral Daily   Continuous Infusions:   Time spent on care of this patient: 35 min   [facility]  [facility] Office  [phone] Pager - Text Page per Loretha Stapler as per below:  On-Call/Text Page:      [email or web address]      password [email or web address]  If 7PM-7AM, please contact night-coverage [email or web address] Password  [facility] 12/03/2012, 7:49 PM   LOS: 1 day

## 2012-12-04 LAB — GLUCOSE, CAPILLARY
Glucose-Capillary: 89 mg/dL (ref 70–99)
Glucose-Capillary: 98 mg/dL (ref 70–99)
Glucose-Capillary: 114 mg/dL — ABNORMAL HIGH (ref 70–99)

## 2012-12-04 LAB — BASIC METABOLIC PANEL
CO2: 23 mEq/L (ref 19–32)
Calcium: 8.6 mg/dL (ref 8.4–10.5)
Creatinine, Ser: 0.5 mg/dL (ref 0.50–1.35)

## 2012-12-04 LAB — CBC
MCH: 29.9 pg (ref 26.0–34.0)
MCV: 84.9 fL (ref 78.0–100.0)
Platelets: 45 10*3/uL — ABNORMAL LOW (ref 150–400)
RBC: 3.91 MIL/uL — ABNORMAL LOW (ref 4.22–5.81)

## 2012-12-04 MED ORDER — SODIUM CHLORIDE 0.9 % IV BOLUS (SEPSIS)
1000.0000 mL | Freq: Once | INTRAVENOUS | Status: AC
  Administered 2012-12-04: 1000 mL via INTRAVENOUS

## 2012-12-04 MED ORDER — INSULIN ASPART 100 UNIT/ML ~~LOC~~ SOLN
0.0000 [IU] | Freq: Four times a day (QID) | SUBCUTANEOUS | Status: DC
  Administered 2012-12-05: 1 [IU] via SUBCUTANEOUS

## 2012-12-04 MED ORDER — ENSURE PUDDING PO PUDG
1.0000 | Freq: Three times a day (TID) | ORAL | Status: DC
  Administered 2012-12-04 – 2012-12-05 (×4): 1 via ORAL

## 2012-12-04 MED ORDER — DEXTROSE-NACL 5-0.9 % IV SOLN: INTRAVENOUS | Status: DC

## 2012-12-04 NOTE — Progress Notes (Signed)
INITIAL NUTRITION ASSESSMENT  DOCUMENTATION CODES Per approved criteria  -Severe malnutrition in the context of chronic illness   INTERVENTION:  Ensure Pudding po TID, each supplement provides 170 kcal and 4 grams of protein.   NUTRITION DIAGNOSIS: Malnutrition related to chronic catabolic illness as evidenced by severe loss of muscle mass and severe loss of subcutaneous fat.   Goal: Intake to meet >90% of estimated nutrition needs.  Monitor:  PO intake, labs, weight trend.  Reason for Assessment: Low BMI  52 y.o. male  Admitting Dx: Acute respiratory failure with hypoxia  ASSESSMENT: Patient admitted from SNF with acute respiratory failure with hypoxia/HCAP, and questionable aspiration. S/P bedside swallow evaluation with SLP today, diet changed to Dysphagia 1 with thin liquids for lunch today. Previously on a CHO-modified diet and eating very well.  Consumed 100% of supper yesterday and at breakfast this morning; 75% of lunch today. Good appetite with good oral intake.  Patient unable to provide any nutrition history.    Patient appears severely malnourished; muscle depletion likely related to Parkinson's disease.  Nutrition Focused Physical Exam:  Subcutaneous Fat:  Orbital Region: severe depletion Upper Arm Region: severe depletion Thoracic and Lumbar Region: NA  Muscle:  Temple Region: severe depletion Clavicle Bone Region: severe depletion Clavicle and Acromion Bone Region: severe depletion Scapular Bone Region: NA Dorsal Hand: severe depletion Patellar Region: severe depletion Anterior Thigh Region: severe depletion Posterior Calf Region: severe depletion  Edema: none    Pt meets criteria for severe MALNUTRITION in the context of chronic illness as evidenced by severe loss of muscle mass and severe loss of subcutaneous fat.   Height: Ht Readings from Last 1 Encounters:  12/02/12 6' 0.83" (1.85 m)    Weight: Wt Readings from Last 1 Encounters:   12/02/12 138 lb 14.2 oz (63 kg)    Ideal Body Weight: 83.6 kg  % Ideal Body Weight: 75%  Wt Readings from Last 10 Encounters:  12/02/12 138 lb 14.2 oz (63 kg)  11/24/12 139 lb (63.05 kg)  03/06/09 224 lb (101.606 kg)  12/26/08 222 lb (100.699 kg)  10/29/08 223 lb 4 oz (101.266 kg)  10/22/08 229 lb (103.874 kg)  09/24/08 223 lb 8 oz (101.379 kg)  02/13/08 219 lb (99.338 kg)  12/28/07 208 lb (94.348 kg)  11/03/07 205 lb (92.987 kg)    Usual Body Weight: 224 lb (3.5 years ago)  % Usual Body Weight: 62%  BMI:  Body mass index is 18.41 kg/(m^2). underweight  Estimated Nutritional Needs: Kcal: 1900-2100 Protein: 95-110 gm Fluid: 2 L  Skin: abrasion on right lower leg  Diet Order: Dysphagia 1 with thin liquids  EDUCATION NEEDS: -Education not appropriate at this time   Intake/Output Summary (Last 24 hours) at 12/04/12 1413 Last data filed at 12/04/12 1200  Gross per 24 hour  Intake   2180 ml  Output   2550 ml  Net   -370 ml    Last BM: 3/30   Labs:   Recent Labs Lab 12/02/12 1050 12/03/12 0425 12/04/12 0520  NA 140 134* 138  K 4.7 4.6 4.2  CL 103 103 105  CO2 26 24 23   BUN 16 12 15   CREATININE 0.66 0.51 0.50  CALCIUM 9.0 8.9 8.6  GLUCOSE 107* 94 82    CBG (last 3)   Recent Labs  12/03/12 1713 12/03/12 2211 12/04/12 0805  GLUCAP 113* 136* 89    Scheduled Meds: . carbidopa-levodopa  1 tablet Oral TID  . docusate sodium  100 mg Oral BID  . insulin aspart  0-9 Units Subcutaneous Q6H  . levofloxacin (LEVAQUIN) IV  750 mg Intravenous Q24H  . pramipexole  0.25 mg Oral TID  . QUEtiapine  25 mg Oral Daily  . QUEtiapine  75 mg Oral QHS  . venlafaxine  75 mg Oral Daily    Continuous Infusions: . dextrose 5 % and 0.9% NaCl      Past Medical History  Diagnosis Date  . Parkinson disease   . Diabetes mellitus   . Ulcer   . Anxiety     Past Surgical History  Procedure Laterality Date  . Cholecystectomy      Joaquin Courts, RD, LDN,  CNSC Pager# [phone] After Hours Pager# [phone]

## 2012-12-04 NOTE — Evaluation (Signed)
Clinical/Bedside Swallow Evaluation Patient Details  Name: Logan Hernandez MRN: [id number] Date of Birth: [birth year]  Today's Date: 12/04/2012 Time: 1215-1230 SLP Time Calculation (min): 15 min  Past Medical History:  Past Medical History  Diagnosis Date  . Parkinson disease   . Diabetes mellitus   . Ulcer   . Anxiety    Past Surgical History:  Past Surgical History  Procedure Laterality Date  . Cholecystectomy     HPI:  52 y.o. male with hx of Parkinson's admitted from SNF with acute respiratory failure with hypoxia/ HCAP, questionable aspiration.         Assessment / Plan / Recommendation Clinical Impression  Pt presents with likely baseline oropharyngeal dysphagia c/w Parkinson's disease, with decreased oral manipulation, delayed swallow, and likely pharyngeal weakness.  He may have had an aspiration event precipitating hospitalization - however, swallow appears to have returned to baseline, is functional, and patient appeared to protect his airway adequately with all consistencies tested.  Dysphagia-related aspiration events are likely to recur.    Efforts to communicate are difficult, given hypokinetic dysarthria, but pt is able to respond to yes/no questions re: personal care with clarity.    Recommend dysphagia 1 diet with thin liquids; full assist with meals.  Hand feed at slow rate with reasonable bolus size.  No SLP f/u warranted.      Aspiration Risk  Mild    Diet Recommendation Dysphagia 1 (Puree);Thin liquid   Liquid Administration via: Cup;Straw Medication Administration: Whole meds with liquid Supervision: Staff feed patient Compensations: Slow rate;Small sips/bites Postural Changes and/or Swallow Maneuvers: Seated upright 90 degrees    Other  Recommendations Oral Care Recommendations: Oral care BID   Follow Up Recommendations  None     Rehanna Oloughlin L. Samson Frederic, [place] CCC/SLP Pager [phone]                       Blenda Mounts Laurice 12/04/2012,12:48  PM

## 2012-12-04 NOTE — Progress Notes (Addendum)
Pt to TX to [id number], VSS. Called report.

## 2012-12-04 NOTE — Progress Notes (Signed)
[facility] Progress Note [place] TEAM 1 - Stepdown/ICU TEAM   PIERCE BAROCIO ZOX:[id number] DOB: Jul 14, 1961 DOA: 12/02/2012 PCP: Terald Sleeper, MD  Brief narrative: 52 y/o man from SNF. Hx could not be obtained at admit as pt was on NIPPV. Only contact number in chart was for patient's father and there was no response at that number. All history was per EDP. Apparently patient was fine at SNF - had breakfast - and then started becoming SOB. Was transferred to the ED for further evaluation. Has a history of advanced Parkinson's Disease. He is a DNR.  Assessment/Plan:  Acute respiratory failure with hypoxia /  HCAP vs/ aspiration  - possible aspiration? - no clinical s/sx of ongoing infection - will narrow abx to levaquin alone and consider d/c after short course if remains stable  - SLP eval ordered - change diet to sips w/ meds only until seen by SLP - CXR at admit w/o focal infiltrate noted - recheck CXR in AM  DIABETES MELLITUS, TYPE II - cont current treatment - CBG reasonably controlled at this time   PARKINSON'S DISEASE -stable on current medication  Thrombocytopenia - plt count was 99 on 12/05/2011 - avoiding heparin products and ASA - recheck in AM - no evidence of active bleeding   Leukopenia - WBC was normal in 2013 (7.4)  Normocytic anemia - Hgb was 13.2 in March 2013 - no clear evidence of blood loss - check Fe stores, B12, folate  Anxiety D/O Cont usual effexor  Abnormal UA UA reveals Ca Oxalate crystals, WBC, and RBC - urine cx was unrevealing - pt denies dysuria or flank pain - follow  Code Status: DNR Family Communication: no family present at time of exam Disposition Plan: transfer to Med/Surg bed  Consultants: none  Procedures: none  Antibiotics: Vanc 3/29 >> 3/31 Cefepime 3/29 >> 3/31 Levaquin 3/29 >>  DVT prophylaxis: SCDs  HPI/Subjective: Patient is alert and interactive though speech is quite difficult to follow.  He  indicates that he is not in pain.  He indicates he is not short of breath.  He indicates that he is not experiencing abdominal pain nausea or vomiting.  Objective: Blood pressure 119/74, pulse 58, temperature 97.8 F (36.6 C), temperature source Oral, resp. rate 16, height 6' 0.84" (1.85 m), weight 63 kg (138 lb 14.2 oz), SpO2 99.00%.  Intake/Output Summary (Last 24 hours) at 12/04/12 1149 Last data filed at 12/04/12 0609  Gross per 24 hour  Intake   2520 ml  Output   1625 ml  Net    895 ml    Exam: General: No acute respiratory distress - breathing comfortably at rest Lungs: Clear to auscultation bilaterally without wheezes or focal crackles Cardiovascular: Regular rate and rhythm without murmur gallop or rub normal S1 and S2 Abdomen: Nontender, nondistended, soft, bowel sounds positive, no rebound, no ascites, no appreciable mass Extremities: No significant cyanosis, clubbing, or edema bilateral lower extremities GU: foley cath in place draining transparent yellow urine  Data Reviewed: Basic Metabolic Panel:  Recent Labs Lab 12/02/12 1050 12/03/12 0425 12/04/12 0520  NA 140 134* 138  K 4.7 4.6 4.2  CL 103 103 105  CO2 26 24 23   GLUCOSE 107* 94 82  BUN 16 12 15   CREATININE 0.66 0.51 0.50  CALCIUM 9.0 8.9 8.6   Liver Function Tests:  Recent Labs Lab 12/02/12 1050  AST 40*  ALT 38  ALKPHOS 65  BILITOT 0.8  PROT 7.2  ALBUMIN 3.9  CBC:  Recent Labs Lab 12/02/12 1050 12/03/12 0425 12/04/12 0520  WBC 5.2 4.4 2.2*  NEUTROABS 4.5  --   --   HGB 12.9* 12.2* 11.7*  HCT 36.8* 34.5* 33.2*  MCV 83.6 86.0 84.9  PLT 67* 59* 45*   CBG:  Recent Labs Lab 12/03/12 0719 12/03/12 1151 12/03/12 1713 12/03/12 2211 12/04/12 0805  GLUCAP 103* 110* 113* 136* 89    Recent Results (from the past 240 hour(s))  CULTURE, BLOOD (ROUTINE X 2)     Status: None   Collection Time    12/02/12 10:50 AM      Result Value Range Status   Specimen Description BLOOD RIGHT ARM    Final   Special Requests BOTTLES DRAWN AEROBIC AND ANAEROBIC 10CC   Final   Culture  Setup Time 12/02/2012 18:02   Final   Culture     Final   Value:        BLOOD CULTURE RECEIVED NO GROWTH TO DATE CULTURE WILL BE HELD FOR 5 DAYS BEFORE ISSUING A FINAL NEGATIVE REPORT   Report Status PENDING   Incomplete  URINE CULTURE     Status: None   Collection Time    12/02/12 10:53 AM      Result Value Range Status   Specimen Description URINE, CATHETERIZED   Final   Special Requests NONE   Final   Culture  Setup Time 12/02/2012 17:35   Final   Colony Count NO GROWTH   Final   Culture NO GROWTH   Final   Report Status 12/03/2012 FINAL   Final  CULTURE, BLOOD (ROUTINE X 2)     Status: None   Collection Time    12/02/12 10:55 AM      Result Value Range Status   Specimen Description BLOOD RIGHT ARM   Final   Special Requests BOTTLES DRAWN AEROBIC AND ANAEROBIC 10CC   Final   Culture  Setup Time 12/02/2012 18:02   Final   Culture     Final   Value:        BLOOD CULTURE RECEIVED NO GROWTH TO DATE CULTURE WILL BE HELD FOR 5 DAYS BEFORE ISSUING A FINAL NEGATIVE REPORT   Report Status PENDING   Incomplete  MRSA PCR SCREENING     Status: None   Collection Time    12/02/12  4:03 PM      Result Value Range Status   MRSA by PCR NEGATIVE  NEGATIVE Final   Comment:            The GeneXpert MRSA Assay (FDA     approved for NASAL specimens     only), is one component of a     comprehensive MRSA colonization     surveillance program. It is not     intended to diagnose MRSA     infection nor to guide or     monitor treatment for     MRSA infections.     Studies:  Recent x-ray studies have been reviewed in detail by the Attending Physician  Scheduled Meds:  Scheduled Meds: . carbidopa-levodopa  1 tablet Oral TID  . docusate sodium  100 mg Oral BID  . insulin aspart  0-9 Units Subcutaneous Q6H  . levofloxacin (LEVAQUIN) IV  750 mg Intravenous Q24H  . pramipexole  0.25 mg Oral TID  .  QUEtiapine  25 mg Oral Daily  . QUEtiapine  75 mg Oral QHS  . venlafaxine  75 mg Oral Daily  Continuous Infusions: . dextrose 5 % and 0.9% NaCl      Time spent on care of this patient: 35 min   [facility]  [facility] Office  [phone] Pager - Text Page per Loretha Stapler as per below:  On-Call/Text Page:      [email or web address]      password [email or web address]  If 7PM-7AM, please contact night-coverage [email or web address] Password [facility] 12/04/2012, 11:49 AM   LOS: 2 days

## 2012-12-04 NOTE — Clinical Social Work Psychosocial (Signed)
     Clinical Social Work Department BRIEF PSYCHOSOCIAL ASSESSMENT 12/04/2012  Patient:  Logan Hernandez, Logan Hernandez     Account Number:  [id number]     Admit date:  12/02/2012  Clinical Social Worker:  Lourdes Sledge  Date/Time:  12/04/2012 11:27 AM  Referred by:  RN  Date Referred:  12/04/2012 Referred for  SNF Placement   Other Referral:   Interview type:  Family Other interview type:   CSW completed assessment with pt father Mr. Logan Vavra Sr. [phone]    PSYCHOSOCIAL DATA Living Status:  FACILITY Admitted from facility:  [facility] Level of care:  Skilled Nursing Facility Primary support name:  Mr. Logan Paye Sr. [phone] Primary support relationship to patient:  PARENT Degree of support available:   Pt father presents as pt main contact. Father is actively involved in pt care.    CURRENT CONCERNS Current Concerns  Post-Acute Placement   Other Concerns:    SOCIAL WORK ASSESSMENT / PLAN CSW informed that pt was admitted from [facility].    CSW unable to assess pt who presents confused. CSW contacted pt father who confirmed that pt has been at [facility] for approx. 3 years and would like for pt to return at discharge. Pt father stated he was unaware that pt was in the hospital. CSW informed father that staff had tried to contact him yesterday, father apologized but was very pleasant. Father plans on arriving to the hospital to see pt today.  CSW left a message for Logan Hernandez at [facility] to confirm that pt is able to return at discharge.   Assessment/plan status:  Psychosocial Support/Ongoing Assessment of Needs Other assessment/ plan:   Information/referral to community resources:   No resources needed at this time.    PATIENTS/FAMILYS RESPONSE TO PLAN OF CARE: CSW unable to complete assessment with pt. CSW spoke with pt father and confirmed dc plans with him, father very appreciative of CSW intervention. Father plans to arrive to the hospital  today and is agreeable for pt to return to [place] at discharge.

## 2012-12-04 NOTE — Progress Notes (Signed)
Received pt. As a transfer From 3300.

## 2012-12-05 ENCOUNTER — Inpatient Hospital Stay (HOSPITAL_COMMUNITY): Payer: Medicaid Other

## 2012-12-05 DIAGNOSIS — F411 Generalized anxiety disorder: Secondary | ICD-10-CM

## 2012-12-05 LAB — CBC
MCH: 30.2 pg (ref 26.0–34.0)
MCHC: 36.1 g/dL — ABNORMAL HIGH (ref 30.0–36.0)
MCV: 83.5 fL (ref 78.0–100.0)
Platelets: 60 10*3/uL — ABNORMAL LOW (ref 150–400)
RDW: 13.1 % (ref 11.5–15.5)

## 2012-12-05 LAB — IRON AND TIBC
Saturation Ratios: 31 % (ref 20–55)
TIBC: 218 ug/dL (ref 215–435)
UIBC: 151 ug/dL (ref 125–400)

## 2012-12-05 LAB — GLUCOSE, CAPILLARY
Glucose-Capillary: 132 mg/dL — ABNORMAL HIGH (ref 70–99)
Glucose-Capillary: 89 mg/dL (ref 70–99)

## 2012-12-05 LAB — RETICULOCYTES
RBC.: 3.88 MIL/uL — ABNORMAL LOW (ref 4.22–5.81)
Retic Ct Pct: 1.4 % (ref 0.4–3.1)

## 2012-12-05 LAB — FERRITIN: Ferritin: 409 ng/mL — ABNORMAL HIGH (ref 22–322)

## 2012-12-05 MED ORDER — LEVOFLOXACIN 750 MG PO TABS: 750.0000 mg | ORAL_TABLET | Freq: Every day | ORAL | Status: DC

## 2012-12-05 NOTE — Discharge Summary (Addendum)
Physician Discharge Summary  Patient ID: GABRYEL FILES MRN: [id number] DOB/AGE: 1961/02/04 52 y.o.  Admit date: 12/02/2012 Discharge date: 12/05/2012  Primary Care Physician:  Terald Sleeper, MD   Discharge Diagnoses:    Principal Problem:   Acute respiratory failure with hypoxia Active Problems:   DIABETES MELLITUS, TYPE II   PARKINSON'S DISEASE   HCAP (healthcare-associated pneumonia)   Thrombocytopenia      Medication List    TAKE these medications       carbidopa-levodopa 10-100 MG per tablet  Commonly known as:  SINEMET IR  Take 1 tablet by mouth 3 (three) times daily.     cholecalciferol 1000 UNITS tablet  Commonly known as:  VITAMIN D  Take 1,000 Units by mouth daily.     docusate sodium 100 MG capsule  Commonly known as:  COLACE  Take 100 mg by mouth 2 (two) times daily.     insulin aspart protamine-insulin aspart (70-30) 100 UNIT/ML injection  Commonly known as:  NOVOLOG 70/30  Inject 22-27 Units into the skin 2 (two) times daily with a meal. Take 22 units in the morning and 27 units at night     ivermectin 3 MG Tabs  Commonly known as:  STROMECTOL  Take 12 mg by mouth once.     levofloxacin 750 MG tablet  Commonly known as:  LEVAQUIN  Take 1 tablet (750 mg total) by mouth daily.     pramipexole 0.25 MG tablet  Commonly known as:  MIRAPEX  Take 0.25 mg by mouth 3 (three) times daily.     QUEtiapine 25 MG tablet  Commonly known as:  SEROQUEL  Take 25-75 mg by mouth 2 (two) times daily. 1 tablet (25 mg) every morning and 3 tablets (75) mg every night     RESOURCE PO  Take 120 mLs by mouth 2 (two) times daily.     venlafaxine 75 MG tablet  Commonly known as:  EFFEXOR  Take 75 mg by mouth daily.         Disposition and Follow-up:  Will be discharged back to SNF today in stable and improved condition.  Diet Dysphagia I (pureed) with thin liquids. Full assist with meals. Hand feed at slow rate.  Consults:  None   Significant  Diagnostic Studies:  Dg Chest Port 1 View  12/05/2012  *RADIOLOGY REPORT*  Clinical Data: History of focal infiltrate.  Question aspiration. History of Parkinson's disease and diabetes.  PORTABLE CHEST - 1 VIEW  Comparison: 12/02/2012  Findings: Midline trachea.  Normal heart size.  Mild left hemidiaphragm elevation. No pleural effusion or pneumothorax. Lower lobe predominant interstitial thickening is again identified. Cannot exclude developing right base air space disease.  Minimal left base subsegmental atelectasis.  IMPRESSION: Possible developing right lower lobe airspace disease. Recommend attention on follow-up.  Underlying lower lobe predominant chronic interstitial thickening.   Original Report Authenticated By: Jeronimo Greaves, M.D.     Brief H and P: For complete details please refer to admission H and P, but in brief patient is a 52 y/o man from SNF. Cannot obtain any history as he is currently on NIPPV. Only contact number in chart is for patient's father and I have not received a response. All history is per EDP. Apparently patient was fine at SNF had breakfast and then started becoming SOB. Was transferred to the ED for further evaluation. Has a history of advanced Parkinson's Disease. He is a DNR. We were asked to admit him for further evaluation and  management.     Hospital Course:  Principal Problem:   Acute respiratory failure with hypoxia Active Problems:   DIABETES MELLITUS, TYPE II   PARKINSON'S DISEASE   HCAP (healthcare-associated pneumonia)   Thrombocytopenia    Acute respiratory failure with hypoxia / HCAP vs/ aspiration  - possible aspiration?  - no clinical s/sx of ongoing infection - will narrow abx to levaquin alone for 5 more days following DC. - Speech therapy eval recommended Dysphagia I diet with thin liquids.  DIABETES MELLITUS, TYPE II  - cont current treatment - CBG reasonably controlled at this time   PARKINSON'S DISEASE  -stable on current medication    Thrombocytopenia  - platelet count has been in the 60s. -this is chronic and has not changed in the past 2 years that I can see upon chart review.  Anxiety D/O  Cont usual effexor  Severe protein/caloric malnutrition    Time spent on Discharge: Greater than 30 minutes.  SignedChaya Jan [facility] Pager: [phone] 12/05/2012, 10:11 AM

## 2012-12-05 NOTE — Clinical Social Work Note (Signed)
Patient is medically stable for discharge today, 12/05/2012. Patient will be returning to [facility]. Facility contacted to inform of patient discharge.  Fernande Boyden, Social Work Intern 12/05/2012  Co-Signed: Genelle Bal, LCSW [phone]

## 2012-12-05 NOTE — Progress Notes (Signed)
IV was removed by Cherylann Ratel nurse),report was call by Harriett Sine B.and foley catheter was removed as well by Harriett Sine B.Pt. Ready to go to [facility].

## 2012-12-07 ENCOUNTER — Non-Acute Institutional Stay (SKILLED_NURSING_FACILITY): Payer: Medicaid Other | Admitting: Adult Health

## 2012-12-07 ENCOUNTER — Encounter: Payer: Self-pay | Admitting: Adult Health

## 2012-12-07 DIAGNOSIS — J189 Pneumonia, unspecified organism: Secondary | ICD-10-CM

## 2012-12-07 DIAGNOSIS — K5900 Constipation, unspecified: Secondary | ICD-10-CM

## 2012-12-07 DIAGNOSIS — E119 Type 2 diabetes mellitus without complications: Secondary | ICD-10-CM

## 2012-12-07 DIAGNOSIS — G20 Parkinson's disease: Secondary | ICD-10-CM

## 2012-12-07 DIAGNOSIS — E559 Vitamin D deficiency, unspecified: Secondary | ICD-10-CM

## 2012-12-07 DIAGNOSIS — F29 Unspecified psychosis not due to a substance or known physiological condition: Secondary | ICD-10-CM

## 2012-12-07 DIAGNOSIS — F329 Major depressive disorder, single episode, unspecified: Secondary | ICD-10-CM

## 2012-12-07 NOTE — Assessment & Plan Note (Signed)
Is taking vitamin d 100 units daily  

## 2012-12-07 NOTE — Assessment & Plan Note (Signed)
Is taking colace twice daily

## 2012-12-07 NOTE — Progress Notes (Signed)
Patient ID: Logan Hernandez, male   DOB: Feb 01, 1961, 52 y.o.   MRN: [id number]   Chief Complaint  Patient presents with  . Medical Managment of Chronic Issues    hospitalization follow up     HPI:    he has been hospitalized for pneumonia with acute resp failure with hypoxia. He is a long term care resident of this facility. He was treated with abt and will require another 5 days of therapy. There was concern of his aspiration and he is to be on a dysphagia diet with thin liquids.  PARKINSON'S DISEASE He has had a slow decline over the past year; he is wheelchair bound he is taking sinemet 10/100 mg three times daily and mirapex 0.25 mg three times daily   Psychosis He is taking seroquel 25 mg in the am and 75 mg in the pm; the nursing does not report any behavioral issues; no signs of psychosis present; he will on some occasions attempt to get out of the wheelchair; there are no reports of any aggression toward the staff; or other residents.   HCAP (healthcare-associated pneumonia) He has been hospitalized for pneumonia there was concern for aspiration; he is on a dysphagia diet with thin liquids; and is on levaquin for another 5 days.   CONSTIPATION Is taking colace twice daily  DIABETES MELLITUS, TYPE II Is taking novolog mix 70/30 22 units in the am and 27 units in the pm  DEPRESSION He is stable is taking effexor 75 mg daily   Unspecified vitamin D deficiency Is taking vitamin d 100 units daily     Past Medical History  Diagnosis Date  . Parkinson disease   . Diabetes mellitus   . Ulcer   . Anxiety     Past Surgical History  Procedure Laterality Date  . Cholecystectomy      VITAL SIGNS BP 92/52  Pulse 62  Ht 6\' 1"  (1.854 m)  Wt 140 lb (63.504 kg)  BMI 18.47 kg/m2   Patient's Medications  New Prescriptions   No medications on file  Previous Medications   CARBIDOPA-LEVODOPA (SINEMET) 10-100 MG PER TABLET    Take 1 tablet by mouth 3 (three) times daily.   CHOLECALCIFEROL (VITAMIN D) 1000 UNITS TABLET    Take 1,000 Units by mouth daily.   DOCUSATE SODIUM (COLACE) 100 MG CAPSULE    Take 100 mg by mouth 2 (two) times daily.   INSULIN ASPART PROTAMINE-INSULIN ASPART (NOVOLOG 70/30) (70-30) 100 UNIT/ML INJECTION    Inject 22-27 Units into the skin 2 (two) times daily with a meal. Take 22 units in the morning and 27 units at night   IVERMECTIN (STROMECTOL) 3 MG TABS    Take 12 mg by mouth once.   NUTRITIONAL SUPPLEMENTS (RESOURCE PO)    Take 120 mLs by mouth 2 (two) times daily.   PRAMIPEXOLE (MIRAPEX) 0.25 MG TABLET    Take 0.25 mg by mouth 3 (three) times daily.   QUETIAPINE (SEROQUEL) 25 MG TABLET    Take 25-75 mg by mouth 2 (two) times daily. 1 tablet (25 mg) every morning and 3 tablets (75) mg every night   VENLAFAXINE (EFFEXOR) 75 MG TABLET    Take 75 mg by mouth daily.  Modified Medications   Modified Medication Previous Medication   LEVOFLOXACIN (LEVAQUIN) 750 MG TABLET levofloxacin (LEVAQUIN) 750 MG tablet      Take 750 mg by mouth daily.    Take 1 tablet (750 mg total) by mouth daily.  Discontinued Medications   No medications on file    SIGNIFICANT DIAGNOSTIC EXAMS  12-04-12: chest x-ray: Shallow inspiraPossible developing right lower lobe airspace disease. 12-05-12: chest x-ray: Underlying lower lobe predominant chronic interstitial thickening. tion. No evidence of active pulmonary disease.  LAB REVIEWED:  12-02-12: wbc 5,2;hgb 12.9; hct 36.8;mcv 83.6;plt 67; glucose 107; bun 16; creat 0.,66; k+ 4.7 Na++ 140 liver normal albumin 3.9; hiv nr; urine culture: no growth  12-05-12: ammonia 27; iron 67; tibc 218; ferritin 409; folate 104; vit b12: 410   Review of Systems  Unable to perform ROS    Physical Exam  Constitutional:  Is thin  Neck: Neck supple.  Cardiovascular: Normal rate, regular rhythm and intact distal pulses.   Respiratory: Effort normal and breath sounds normal.  GI: Soft. Bowel sounds are normal.  Musculoskeletal:  He exhibits no edema.  Is able to move extremities; has stiffness present   Neurological: He is alert.  Oriented to self  Skin: Skin is warm and dry.  Psychiatric: He has a normal mood and affect.     ASSESSMENT/ PLAN:  Will lower his seroquel and will continue to monitor his status; will continue the rest of his medication regimen and will make further adjustments as needed   FUTURE ORDERS:  Will lower seroquel to 25 mg twice daily and will monitor his status.   Time spent with patient 50 minutes.

## 2012-12-07 NOTE — Assessment & Plan Note (Signed)
He has had a slow decline over the past year; he is wheelchair bound he is taking sinemet 10/100 mg three times daily and mirapex 0.25 mg three times daily

## 2012-12-07 NOTE — Assessment & Plan Note (Signed)
He is taking seroquel 25 mg in the am and 75 mg in the pm; the nursing does not report any behavioral issues; no signs of psychosis present; he will on some occasions attempt to get out of the wheelchair; there are no reports of any aggression toward the staff; or other residents.

## 2012-12-07 NOTE — Assessment & Plan Note (Signed)
Is taking novolog mix 70/30 22 units in the am and 27 units in the pm

## 2012-12-07 NOTE — Assessment & Plan Note (Signed)
He is stable is taking effexor 75 mg daily

## 2012-12-07 NOTE — Assessment & Plan Note (Signed)
He has been hospitalized for pneumonia there was concern for aspiration; he is on a dysphagia diet with thin liquids; and is on levaquin for another 5 days.

## 2012-12-08 LAB — CULTURE, BLOOD (ROUTINE X 2)
Culture: NO GROWTH
Culture: NO GROWTH

## 2012-12-12 ENCOUNTER — Non-Acute Institutional Stay (SKILLED_NURSING_FACILITY): Payer: Medicaid Other | Admitting: Internal Medicine

## 2012-12-12 DIAGNOSIS — E1149 Type 2 diabetes mellitus with other diabetic neurological complication: Secondary | ICD-10-CM

## 2012-12-12 DIAGNOSIS — R1319 Other dysphagia: Secondary | ICD-10-CM

## 2012-12-12 DIAGNOSIS — J690 Pneumonitis due to inhalation of food and vomit: Secondary | ICD-10-CM

## 2012-12-12 DIAGNOSIS — G20 Parkinson's disease: Secondary | ICD-10-CM

## 2012-12-12 NOTE — Progress Notes (Signed)
Patient ID: Logan Hernandez, male   DOB: 1961-04-02, 52 y.o.   MRN: [id number] Chief complaint-readmission to SNF status post stay at [facility] 12/03/2002 2 through 12/05/2012  History-Mr. Grassel is a 52 year old man with unfortunately advanced Parkinson's disease. I believe he has been in the building since 2011. Most of his disability is secondary to Parkinson's disease, I believe he also has Parkinson's dementia complex, and unfortunately has a history of psychosis which has been severe enough to require Seroquel. The patient was admitted on this occasion with hypoxemia and respiratory distress. He was felt to believe have aspiration pneumonitis chest x-ray showed right ear base air space disease. He is readmitted to the facility on Levaquin. His status seems to have stabilized.  Past medical; this admission largely as a consequence of healthcare acquired pneumonia. 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 He has been hepatitis C positive although the history here is unclear, I am uncertain whether he received treatment. #4 chronic thrombocytopenia #5 history of urinary retention #6 depression  Medications; Sinemet 10/100 one tablet 3 times daily, vitamin D thousand units daily, insulin NovoLog 7030 22 units in the morning 27 units at night, Levaquin which I will continue for a total of 7 days, Mirapex 0.25 3 times daily, Seroquel 25 units in the morning and 75 units at night, Effexor 75 mg daily  Social history patient has been here since 2012. Has a DO NOT RESUSCITATE on the chart  Family his not currently available  Review of systems; limited by the patient's dysarthric speech Respiratory he is not complaining of shortness of breath Cardiac no chest pain GI not complaining of swallowing difficulties  Physical exam; Gen. he is not in any  distress Pulse 82 respirations 18 HEENT oral exam is normal with no oral lesions. Note that marked wasting of the temporal muscular Respiratory shallow but otherwise clear entry. No wheezing worker breathing is normal no accessory muscle use. Cardiac heart sounds are normal no murmurs no bruits. Appears to be euvolemic Abdomen no liver or spleen is palpable no tenderness GU no bladder distention or tenderness no CVA tenderness Extremities peripheral pulses are faint suspect there is some degree of PAD but no lesions and the patient appears to be asymptomatic from this regard Neurologic; cranial nerves no obvious abnormalities uvula is midline. He appears to be generally weak but nonlateralizing. Note increased tone myoclonic jerking in both feet does not have the appearance of a Parkinson's tremor. Reflexes are diffusely reduced. Left plantar is extensor right is flexor. I did not attempt to test hisgait SP his speech is dysarthric and at times somewhat difficult to understand. I suspect that he is aspirating having trouble managing his saliva  Impression/plan #1 Parkinson's disease with dementia complex, psychosis. His Parkinson's disease is advanced. We should be careful about any further addition of Seroquel in the setting. We will need firm evidence of active disabling psychosis to justify the risk. #2 dysphagia probably secondary to Parkinson's disease. Speech therapy is already working with him #3 probable aspiration pneumonitis we'll complete Levaquin for 7 days #4 apparent history of cirrhosis of the liver due to hepatitis C. Would seem likely that he has portal hypertension and as a result thrombocytopenia although I'm not certain about this. I believe he has been followed by GI in the past.  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 He is not on aspirin or ACE inhibitors. Last hemoglobin A1c IC was 5.2 in  January 2014  Lead to monitor his oral intake and swallowing carefully. He looks as though he has lost weight although apparently that is not well documented in the record. As mentioned his psychosis would need to be disabling and/or put the patient or others at risk to justify the increasing dose of neuroleptics. Today I see none of this. We will follow this longitudinally

## 2012-12-15 ENCOUNTER — Non-Acute Institutional Stay (SKILLED_NURSING_FACILITY): Payer: Medicaid Other | Admitting: Adult Health

## 2012-12-15 ENCOUNTER — Encounter: Payer: Self-pay | Admitting: Adult Health

## 2012-12-15 DIAGNOSIS — E119 Type 2 diabetes mellitus without complications: Secondary | ICD-10-CM

## 2012-12-15 DIAGNOSIS — G20 Parkinson's disease: Secondary | ICD-10-CM

## 2012-12-15 DIAGNOSIS — G20A1 Parkinson's disease without dyskinesia, without mention of fluctuations: Secondary | ICD-10-CM

## 2012-12-15 NOTE — Assessment & Plan Note (Signed)
His diabetes is poorly controlled he has had episodes of low glucose readings.  His am readings are around 100 or less the afternoon readings are in the mid to upper 100's. He has required one dose of glucagon this past week. He is taking novlog mix 70/30 22 units in the am and 27 units in the pm.

## 2012-12-15 NOTE — Assessment & Plan Note (Signed)
There is no change in his status is taking sinemet 10/100 mg three times daily takes mirapex 0.25 mg three times daily as well.

## 2012-12-15 NOTE — Progress Notes (Signed)
Patient ID: Logan Hernandez, male   DOB: 08/29/1961, 52 y.o.   MRN: [id number]  Chief Complaint  Patient presents with  . Acute Visit    staff concerns     HPI:  DIABETES MELLITUS, TYPE II His diabetes is poorly controlled he has had episodes of low glucose readings.  His am readings are around 100 or less the afternoon readings are in the mid to upper 100's. He has required one dose of glucagon this past week. He is taking novlog mix 70/30 22 units in the am and 27 units in the pm.   PARKINSON'S DISEASE There is no change in his status is taking sinemet 10/100 mg three times daily takes mirapex 0.25 mg three times daily as well.    Past Medical History  Diagnosis Date  . Parkinson disease   . Diabetes mellitus   . Ulcer   . Anxiety     Past Surgical History  Procedure Laterality Date  . Cholecystectomy      VITAL SIGNS BP 98/55  Pulse 62  Ht 6\' 1"  (1.854 m)  Wt 140 lb (63.504 kg)  BMI 18.47 kg/m2   Patient's Medications  New Prescriptions   No medications on file  Previous Medications   CARBIDOPA-LEVODOPA (SINEMET) 10-100 MG PER TABLET    Take 1 tablet by mouth 3 (three) times daily.   CHOLECALCIFEROL (VITAMIN D) 1000 UNITS TABLET    Take 1,000 Units by mouth daily.   DOCUSATE SODIUM (COLACE) 100 MG CAPSULE    Take 100 mg by mouth 2 (two) times daily.   INSULIN ASPART PROTAMINE-INSULIN ASPART (NOVOLOG 70/30) (70-30) 100 UNIT/ML INJECTION    Inject 22-27 Units into the skin 2 (two) times daily with a meal. Take 22 units in the morning and 27 units at night   IVERMECTIN (STROMECTOL) 3 MG TABS    Take 12 mg by mouth once.   NUTRITIONAL SUPPLEMENTS (RESOURCE PO)    Take 120 mLs by mouth 2 (two) times daily.   PRAMIPEXOLE (MIRAPEX) 0.25 MG TABLET    Take 0.25 mg by mouth 3 (three) times daily.   QUETIAPINE (SEROQUEL) 25 MG TABLET    Take 25 mg by mouth 2 (two) times daily.    VENLAFAXINE (EFFEXOR) 75 MG TABLET    Take 75 mg by mouth daily.  Modified Medications   No  medications on file  Discontinued Medications   No medications on file    SIGNIFICANT DIAGNOSTIC EXAMS  12-04-12: chest x-ray: Shallow inspiraPossible developing right lower lobe airspace disease. 12-05-12: chest x-ray: Underlying lower lobe predominant chronic interstitial thickening. tion. No evidence of active pulmonary disease.      Component Value Date/Time   ALBUMIN 3.9 12/02/2012 1050   AST 40* 12/02/2012 1050   ALT 38 12/02/2012 1050   ALKPHOS 65 12/02/2012 1050   BILITOT 0.8 12/02/2012 1050       Component Value Date/Time   BUN 15 12/04/2012 0520   GLUCOSE 82 12/04/2012 0520   CREATININE 0.50 12/04/2012 0520   K 4.2 12/04/2012 0520   NA 138 12/04/2012 0520       Component Value Date/Time   WBC 2.4* 12/05/2012 0445   RBC 3.88* 12/05/2012 0445   HGB 11.7* 12/05/2012 0445   HCT 32.4* 12/05/2012 0445   PLT 60* 12/05/2012 0445   MCV 83.5 12/05/2012 0445   12-02-12: wbc 5,2;hgb 12.9; hct 36.8;mcv 83.6;plt 67; glucose 107; bun 16; creat 0.,66; k+ 4.7 Na++ 140 liver normal albumin 3.9; hiv nr;  urine culture: no growth  12-05-12: ammonia 27; iron 67; tibc 218; ferritin 409; folate 104; vit b12: 410    Review of Systems  Unable to perform ROS      Physical Exam  Constitutional:  Is thin  Neck: Neck supple.  Cardiovascular: Normal rate, regular rhythm and intact distal pulses.   Respiratory: Effort normal and breath sounds normal.  GI: Soft. Bowel sounds are normal.  Musculoskeletal: He exhibits no edema.  Is able to move extremities; has stiffness present   Neurological: He is alert.  Oriented to self  Skin: Skin is warm and dry.  Psychiatric: He has a normal mood and affect.     ASSESSMENT/ PLAN:  Will continue current regimen for his parkinson disease; will lower his pm novolog mix  to 15 units and will continue to monitor his status

## 2013-01-05 ENCOUNTER — Non-Acute Institutional Stay (SKILLED_NURSING_FACILITY): Payer: Medicaid Other | Admitting: Adult Health

## 2013-01-05 DIAGNOSIS — G20 Parkinson's disease: Secondary | ICD-10-CM

## 2013-01-05 DIAGNOSIS — F3289 Other specified depressive episodes: Secondary | ICD-10-CM

## 2013-01-05 DIAGNOSIS — IMO0002 Reserved for concepts with insufficient information to code with codable children: Secondary | ICD-10-CM

## 2013-01-05 DIAGNOSIS — E118 Type 2 diabetes mellitus with unspecified complications: Secondary | ICD-10-CM

## 2013-01-05 DIAGNOSIS — G20A1 Parkinson's disease without dyskinesia, without mention of fluctuations: Secondary | ICD-10-CM

## 2013-01-05 DIAGNOSIS — I10 Essential (primary) hypertension: Secondary | ICD-10-CM

## 2013-01-05 DIAGNOSIS — F29 Unspecified psychosis not due to a substance or known physiological condition: Secondary | ICD-10-CM

## 2013-01-05 DIAGNOSIS — F329 Major depressive disorder, single episode, unspecified: Secondary | ICD-10-CM

## 2013-01-24 ENCOUNTER — Other Ambulatory Visit: Payer: Self-pay

## 2013-01-24 ENCOUNTER — Emergency Department (HOSPITAL_COMMUNITY)
Admission: EM | Admit: 2013-01-24 | Discharge: 2013-01-24 | Disposition: A | Discharge status: Rehabilitation Facility | Payer: Medicaid Other | Attending: Emergency Medicine | Admitting: Emergency Medicine

## 2013-01-24 ENCOUNTER — Encounter (HOSPITAL_COMMUNITY): Payer: Self-pay | Admitting: Emergency Medicine

## 2013-01-24 ENCOUNTER — Emergency Department (HOSPITAL_COMMUNITY): Payer: Medicaid Other

## 2013-01-24 DIAGNOSIS — W19XXXA Unspecified fall, initial encounter: Secondary | ICD-10-CM

## 2013-01-24 DIAGNOSIS — Y9210 Unspecified residential institution as the place of occurrence of the external cause: Secondary | ICD-10-CM | POA: Insufficient documentation

## 2013-01-24 DIAGNOSIS — G20A1 Parkinson's disease without dyskinesia, without mention of fluctuations: Secondary | ICD-10-CM | POA: Insufficient documentation

## 2013-01-24 DIAGNOSIS — Z7982 Long term (current) use of aspirin: Secondary | ICD-10-CM | POA: Insufficient documentation

## 2013-01-24 DIAGNOSIS — Z79899 Other long term (current) drug therapy: Secondary | ICD-10-CM | POA: Insufficient documentation

## 2013-01-24 DIAGNOSIS — M549 Dorsalgia, unspecified: Secondary | ICD-10-CM

## 2013-01-24 DIAGNOSIS — Z794 Long term (current) use of insulin: Secondary | ICD-10-CM | POA: Insufficient documentation

## 2013-01-24 DIAGNOSIS — R296 Repeated falls: Secondary | ICD-10-CM | POA: Insufficient documentation

## 2013-01-24 DIAGNOSIS — G20 Parkinson's disease: Secondary | ICD-10-CM | POA: Insufficient documentation

## 2013-01-24 DIAGNOSIS — Y9389 Activity, other specified: Secondary | ICD-10-CM | POA: Insufficient documentation

## 2013-01-24 DIAGNOSIS — E119 Type 2 diabetes mellitus without complications: Secondary | ICD-10-CM | POA: Insufficient documentation

## 2013-01-24 DIAGNOSIS — F411 Generalized anxiety disorder: Secondary | ICD-10-CM | POA: Insufficient documentation

## 2013-01-24 DIAGNOSIS — Z872 Personal history of diseases of the skin and subcutaneous tissue: Secondary | ICD-10-CM | POA: Insufficient documentation

## 2013-01-24 LAB — URINALYSIS, ROUTINE W REFLEX MICROSCOPIC
Hgb urine dipstick: NEGATIVE
Nitrite: NEGATIVE
Specific Gravity, Urine: 1.03 (ref 1.005–1.030)
Urobilinogen, UA: 4 mg/dL — ABNORMAL HIGH (ref 0.0–1.0)

## 2013-01-24 LAB — BASIC METABOLIC PANEL
Chloride: 101 mEq/L (ref 96–112)
GFR calc Af Amer: >90 mL/min (ref >90)
GFR calc non Af Amer: >90 mL/min (ref >90)
Glucose, Bld: 86 mg/dL (ref 70–99)
Potassium: 3.9 mEq/L (ref 3.5–5.1)
Sodium: 135 mEq/L (ref 135–145)

## 2013-01-24 LAB — CBC
Hemoglobin: 12 g/dL — ABNORMAL LOW (ref 13.0–17.0)
MCHC: 35.6 g/dL (ref 30.0–36.0)
RBC: 3.96 MIL/uL — ABNORMAL LOW (ref 4.22–5.81)
WBC: 3.7 10*3/uL — ABNORMAL LOW (ref 4.0–10.5)

## 2013-01-24 NOTE — ED Notes (Signed)
Patient transported to X-ray 

## 2013-01-24 NOTE — ED Notes (Signed)
Pt presents from [facility] with EMS with c/o fall. Pt fell from his wheelchair to the floor in front of the nursing station at the rehab center. Unwitnessed fall. No obvious injuries per EMS. Pt is contracted in his arms and legs but this is norm for patient. Pt has dysphagia, generalized weakness, and Parkinson's and does not speak per norm. Per EMS, patient is not grimacing or making any painful faces upon palpation.

## 2013-01-24 NOTE — ED Notes (Signed)
[facility] called for transport.  

## 2013-01-24 NOTE — ED Provider Notes (Signed)
History     CSN: [id number]  Arrival date & time 01/24/13  1200   First MD Initiated Contact with Patient 01/24/13 1220      Chief Complaint  Patient presents with  . Fall    (Consider location/radiation/quality/duration/timing/severity/associated sxs/prior treatment) HPI Comments: 52 y/o male with a PMHx of Parkinson's Disease, DM and anxiety presents to the ED via EMS from [facility] after an unwitnessed fall. Patient was found off of his wheelchair on the floor in front of the nursing station at [facility]. Per EMS, no obvious injuries. According to nursing home he is at his baseline mentally. Does not speak normally. Patient did not grimace or make any faces as if he were in pain upon their initial examination and palpation. No wounds, deformities.  Patient is a 51 y.o. male presenting with fall. The history is provided by the EMS personnel and medical records.  Fall    Past Medical History  Diagnosis Date  . Parkinson disease   . Diabetes mellitus   . Ulcer   . Anxiety     Past Surgical History  Procedure Laterality Date  . Cholecystectomy      No family history on file.  History  Substance Use Topics  . Smoking status: Never Smoker   . Smokeless tobacco: Never Used  . Alcohol Use: No      Review of Systems  Unable to perform ROS: Patient nonverbal    Allergies  Review of patient's allergies indicates no known allergies.  Home Medications   Current Outpatient Rx  Name  Route  Sig  Dispense  Refill  . aspirin EC 81 MG tablet   Oral   Take 81 mg by mouth daily.         . carbidopa-levodopa (SINEMET) 10-100 MG per tablet   Oral   Take 1 tablet by mouth 3 (three) times daily.         . cholecalciferol (VITAMIN D) 1000 UNITS tablet   Oral   Take 1,000 Units by mouth daily.         Marland Kitchen docusate sodium (COLACE) 100 MG capsule   Oral   Take 100 mg by mouth 2 (two) times daily.         . insulin aspart protamine-insulin aspart  (NOVOLOG 70/30) (70-30) 100 UNIT/ML injection   Subcutaneous   Inject 15-22 Units into the skin 2 (two) times daily with a meal. Take 22 units in the morning and 15 units at night         . lisinopril (PRINIVIL,ZESTRIL) 2.5 MG tablet   Oral   Take 2.5 mg by mouth daily.         . pramipexole (MIRAPEX) 0.25 MG tablet   Oral   Take 0.25 mg by mouth 3 (three) times daily.         Marland Kitchen venlafaxine (EFFEXOR) 75 MG tablet   Oral   Take 75 mg by mouth daily.           BP 119/66  Pulse 74  Temp(Src) 98.2 F (36.8 C) (Oral)  Resp 20  SpO2 100%  Physical Exam  Nursing note and vitals reviewed. Constitutional: Vital signs are normal. He appears well-developed and well-nourished. No distress. Cervical collar and backboard in place.  HENT:  Head: Normocephalic and atraumatic. Head is without Battle's sign, without abrasion and without laceration.  Nose: Nose normal.  Mouth/Throat: Oropharynx is clear and moist and mucous membranes are normal.  Eyes: Conjunctivae and EOM are  normal. Pupils are equal, round, and reactive to light.  Neck: No spinous process tenderness and no muscular tenderness present.  Cardiovascular: Normal rate, regular rhythm, normal heart sounds and intact distal pulses.   Pulmonary/Chest: Effort normal and breath sounds normal. No respiratory distress. He exhibits no tenderness.  Abdominal: Soft. Bowel sounds are normal. He exhibits no distension. There is no tenderness.  Musculoskeletal: He exhibits no edema.       Lumbar back: He exhibits normal pulse.       Back:  Neurological: He is alert.  Skin: Skin is warm, dry and intact. No abrasion, no bruising, no ecchymosis and no laceration noted.  Psychiatric: He is noncommunicative.    ED Course  Procedures (including critical care time)  Labs Reviewed - No data to display Dg Lumbar Spine Complete  01/24/2013   *RADIOLOGY REPORT*  Clinical Data: Mid to lower back pain, fell last night  LUMBAR SPINE -  COMPLETE 4+ VIEW  Comparison: 01/12/2011  Findings: Marked decrease in osseous mineralization. Five non-rib bearing lumbar vertebrae. Degenerative facet disease changes lower lumbar spine. Vertebral body heights maintained. Marked anterior compression deformity of the superior endplate of T12 significantly increased from previous exam. No additional fracture, dislocation, or bone destruction. Scattered atherosclerotic calcifications. Increased stool in rectum. SI joints symmetric.  IMPRESSION: Marked osseous demineralization. Increased T12 compression fracture deformities since 01/12/2011.   Original Report Authenticated By: Ulyses Southward, M.D.   Ct Head Wo Contrast  01/24/2013   *RADIOLOGY REPORT*  Clinical Data:  History of trauma from a fall with injury to the head.  CT HEAD WITHOUT CONTRAST CT CERVICAL SPINE WITHOUT CONTRAST  Technique:  Multidetector CT imaging of the head and cervical spine was performed following the standard protocol without intravenous contrast.  Multiplanar CT image reconstructions of the cervical spine were also generated.  Comparison:  Head CT 12/06/2011.  CT HEAD  Findings: Mild cerebral atrophy.  Patchy areas of mild decreased attenuation throughout the deep and periventricular white matter of the cerebral hemispheres bilaterally, suggestive of mild chronic microvascular ischemic disease. No acute displaced skull fractures are identified.  No acute intracranial abnormality.  Specifically, no evidence of acute post-traumatic intracranial hemorrhage, no definite regions of acute/subacute cerebral ischemia, no focal mass, mass effect, hydrocephalus or abnormal intra or extra-axial fluid collections.  The visualized paranasal sinuses and mastoids are well pneumatized.  IMPRESSION: 1.  No acute displaced skull fractures or evidence to suggest significant acute traumatic injury to the brain. 2.  Mild cerebral atrophy and chronic microvascular ischemic changes in the white matter, as above.   CT CERVICAL SPINE  Findings: No acute displaced fractures of the cervical spine. There is mild multilevel degenerative disc disease, most severe C4- C5 and C6-C7.  At C6-C7 there is approximately 3 mm of anterolisthesis of C6 upon C7.  Mild exaggeration of the normal cervical lordosis.  Alignment is otherwise anatomic.  Prevertebral soft tissues are normal.  Visualized portions of the upper thorax are unremarkable.  IMPRESSION: 1.  No evidence of significant acute traumatic injury to the cervical spine. 2.  Mild multilevel degenerative disc disease, as above.   Original Report Authenticated By: Trudie Reed, M.D.   Ct Cervical Spine Wo Contrast  01/24/2013   *RADIOLOGY REPORT*  Clinical Data:  History of trauma from a fall with injury to the head.  CT HEAD WITHOUT CONTRAST CT CERVICAL SPINE WITHOUT CONTRAST  Technique:  Multidetector CT imaging of the head and cervical spine was performed following the standard protocol  without intravenous contrast.  Multiplanar CT image reconstructions of the cervical spine were also generated.  Comparison:  Head CT 12/06/2011.  CT HEAD  Findings: Mild cerebral atrophy.  Patchy areas of mild decreased attenuation throughout the deep and periventricular white matter of the cerebral hemispheres bilaterally, suggestive of mild chronic microvascular ischemic disease. No acute displaced skull fractures are identified.  No acute intracranial abnormality.  Specifically, no evidence of acute post-traumatic intracranial hemorrhage, no definite regions of acute/subacute cerebral ischemia, no focal mass, mass effect, hydrocephalus or abnormal intra or extra-axial fluid collections.  The visualized paranasal sinuses and mastoids are well pneumatized.  IMPRESSION: 1.  No acute displaced skull fractures or evidence to suggest significant acute traumatic injury to the brain. 2.  Mild cerebral atrophy and chronic microvascular ischemic changes in the white matter, as above.  CT CERVICAL  SPINE  Findings: No acute displaced fractures of the cervical spine. There is mild multilevel degenerative disc disease, most severe C4- C5 and C6-C7.  At C6-C7 there is approximately 3 mm of anterolisthesis of C6 upon C7.  Mild exaggeration of the normal cervical lordosis.  Alignment is otherwise anatomic.  Prevertebral soft tissues are normal.  Visualized portions of the upper thorax are unremarkable.  IMPRESSION: 1.  No evidence of significant acute traumatic injury to the cervical spine. 2.  Mild multilevel degenerative disc disease, as above.   Original Report Authenticated By: Trudie Reed, M.D.     Date: 01/24/2013  Rate: 66  Rhythm: normal sinus rhythm  QRS Axis: normal  Intervals: PR prolonged  ST/T Wave abnormalities: normal  Conduction Disutrbances:first-degree A-V block   Narrative Interpretation: no stemi  Old EKG Reviewed: changes noted first degree AV block, prior EKG on 12/02/12 with sinus tachy, normal sinus on today's EKG    1. Fall, initial encounter   2. Back pain       MDM  52 y/o male with unwitnessed fall. He is in NAD. Able to say "pain" when palpating lumbar spine, otherwise no other abnormalities noted. No skin markings. Baseline mentally per EMS. Obtaining CT head and c spine, lumbar xray, cbc, cmp, ua, EKG.  2:07 PM CT head and c cpine normal. C collar removed, no tenderness of C-spine, no deformity. Lumbar spine xray with worsening compression fracture at T12 from 2012. Labs normal, EKG without any acute abnormality. Patient in NAD. At baseline. Stable to return to nursing home. Case discussed with Dr. Anitra Lauth who agrees with plan of care.    Trevor Mace, PA-C 01/24/13 1409

## 2013-01-24 NOTE — ED Notes (Signed)
Pt returned from xray

## 2013-01-25 LAB — URINE CULTURE: Colony Count: NO GROWTH

## 2013-01-25 NOTE — ED Provider Notes (Signed)
Medical screening examination/treatment/procedure(s) were performed by non-physician practitioner and as supervising physician I was immediately available for consultation/collaboration.   Gwyneth Sprout, MD 01/25/13 [phone]

## 2013-02-02 ENCOUNTER — Non-Acute Institutional Stay (SKILLED_NURSING_FACILITY): Payer: Medicaid Other | Admitting: Adult Health

## 2013-02-02 DIAGNOSIS — E1165 Type 2 diabetes mellitus with hyperglycemia: Secondary | ICD-10-CM

## 2013-02-02 DIAGNOSIS — IMO0002 Reserved for concepts with insufficient information to code with codable children: Secondary | ICD-10-CM

## 2013-02-02 DIAGNOSIS — E118 Type 2 diabetes mellitus with unspecified complications: Secondary | ICD-10-CM

## 2013-02-09 ENCOUNTER — Non-Acute Institutional Stay (SKILLED_NURSING_FACILITY): Payer: Medicaid Other | Admitting: Adult Health

## 2013-02-09 DIAGNOSIS — E1149 Type 2 diabetes mellitus with other diabetic neurological complication: Secondary | ICD-10-CM

## 2013-02-09 DIAGNOSIS — K5900 Constipation, unspecified: Secondary | ICD-10-CM

## 2013-02-09 DIAGNOSIS — G20 Parkinson's disease: Secondary | ICD-10-CM

## 2013-02-09 DIAGNOSIS — F329 Major depressive disorder, single episode, unspecified: Secondary | ICD-10-CM

## 2013-02-09 DIAGNOSIS — I10 Essential (primary) hypertension: Secondary | ICD-10-CM

## 2013-02-09 DIAGNOSIS — F29 Unspecified psychosis not due to a substance or known physiological condition: Secondary | ICD-10-CM

## 2013-02-09 HISTORY — DX: Type 2 diabetes mellitus with other diabetic neurological complication: E11.49

## 2013-03-16 ENCOUNTER — Non-Acute Institutional Stay (SKILLED_NURSING_FACILITY): Payer: Medicaid Other | Admitting: Adult Health

## 2013-03-16 DIAGNOSIS — F329 Major depressive disorder, single episode, unspecified: Secondary | ICD-10-CM

## 2013-03-16 DIAGNOSIS — G20 Parkinson's disease: Secondary | ICD-10-CM

## 2013-03-16 DIAGNOSIS — G20A1 Parkinson's disease without dyskinesia, without mention of fluctuations: Secondary | ICD-10-CM

## 2013-03-16 DIAGNOSIS — I10 Essential (primary) hypertension: Secondary | ICD-10-CM

## 2013-03-16 DIAGNOSIS — K5900 Constipation, unspecified: Secondary | ICD-10-CM

## 2013-03-16 DIAGNOSIS — E1149 Type 2 diabetes mellitus with other diabetic neurological complication: Secondary | ICD-10-CM

## 2013-03-16 DIAGNOSIS — F29 Unspecified psychosis not due to a substance or known physiological condition: Secondary | ICD-10-CM

## 2013-03-16 DIAGNOSIS — F3289 Other specified depressive episodes: Secondary | ICD-10-CM

## 2013-05-10 ENCOUNTER — Non-Acute Institutional Stay (SKILLED_NURSING_FACILITY): Payer: Medicaid Other | Admitting: Adult Health

## 2013-05-10 DIAGNOSIS — F329 Major depressive disorder, single episode, unspecified: Secondary | ICD-10-CM

## 2013-05-10 DIAGNOSIS — E1149 Type 2 diabetes mellitus with other diabetic neurological complication: Secondary | ICD-10-CM

## 2013-05-10 DIAGNOSIS — I10 Essential (primary) hypertension: Secondary | ICD-10-CM

## 2013-05-10 DIAGNOSIS — K5900 Constipation, unspecified: Secondary | ICD-10-CM

## 2013-05-10 DIAGNOSIS — F29 Unspecified psychosis not due to a substance or known physiological condition: Secondary | ICD-10-CM

## 2013-05-10 DIAGNOSIS — G20 Parkinson's disease: Secondary | ICD-10-CM

## 2013-05-24 NOTE — Progress Notes (Signed)
Patient ID: Logan Hernandez, male   DOB: [birth year], 52 y.o.   MRN: [id number]  [place]  No Known Allergies   Chief Complaint  Patient presents with  . Medical Managment of Chronic Issues    HPI  He is being seen for the management of his chronic illnesses; his cbg's are doing well at this time. He is currently on seroquel 25 mg twice daily; however; he is very lethargic and cannot stay awake the nursing staff is aware that he does hallucinate at times but report he has not been agitated. This medication will be weaned off.   Past Medical History  Diagnosis Date  . Parkinson disease   . Diabetes mellitus   . Ulcer   . Anxiety    Past Surgical History  Procedure Laterality Date  . Cholecystectomy      MEDICATIONS  novolog mix 70/30 22 units in the am and 15 units in the pm Asa 81 mg daily seroquel 25 mg twice daily effexor 75 mg daily Sinemet 10/100 mg tid Vit d 100 units daily colace 100 mg twice daily  mirapex 0.25 mg tid  Lisinopril 2.5 mg daily   Filed Vitals:   01/05/13 1312  BP: 132/68  Pulse: 88  Height: 6\' 1"  (1.854 m)  Weight: 145 lb (65.772 kg)     SIGNIFICANT DIAGNOSTIC EXAMS  12-04-12: chest x-ray: Shallow inspiraPossible developing right lower lobe airspace disease. 12-05-12: chest x-ray: Underlying lower lobe predominant chronic interstitial thickening. tion. No evidence of active pulmonary disease.   LABS REVIEWED:   12-02-12: wbc 5,2;hgb 12.9; hct 36.8;mcv 83.6;plt 67; glucose 107; bun 16; creat 0.,66; k+ 4.7 Na++ 140 liver normal albumin 3.9; hiv nr; urine culture: no growth  12-05-12: ammonia 27; iron 67; tibc 218; ferritin 409; folate 104; vit b12: 410  12-27-12: wbc 5.0; hgb 13.7; hct 39.2; mcv 86.2;plt 77; glucose 81; bun 10; creat 0.56; k+4.1; na++139 Liver normal albumin 4.0     Review of Systems  Unable to perform ROS   Physical Exam  Constitutional:  Is thin  Neck: Neck supple.  Cardiovascular: Normal rate, regular rhythm and  intact distal pulses.   Respiratory: Effort normal and breath sounds normal.  GI: Soft. Bowel sounds are normal.  Musculoskeletal: He exhibits no edema.  Is able to move extremities; has stiffness present   Neurological: He is alert.  Oriented to self  Skin: Skin is warm and dry.  Psychiatric: He is lethargic today.    ASSESSMENT AND PLAN  1. Hypertension: stable will continue lisinopril 2.5 mg daily and will  Monitor  2. Parkinson disease: is wihtout change in status; will continue sinemet 10/100 mg three times daily and will continue mirapex 0.25 mg three times dailyand will monitor his status 3. Diabetes type II: will continue novolog mix 70/30: 15 units in the pm and 22 units in the am and will monitor his status and cbg's  4. Constipation: will continue colace twice daily 5. Depression: will continue effexor 75 mg daily  6. Psyhcosis: will lower the seroquel to 25 mg nightly for one week then stop and will monitor his status.

## 2013-06-18 ENCOUNTER — Non-Acute Institutional Stay (SKILLED_NURSING_FACILITY): Payer: Medicaid Other | Admitting: Adult Health

## 2013-06-18 DIAGNOSIS — F3289 Other specified depressive episodes: Secondary | ICD-10-CM

## 2013-06-18 DIAGNOSIS — F29 Unspecified psychosis not due to a substance or known physiological condition: Secondary | ICD-10-CM

## 2013-06-18 DIAGNOSIS — G20 Parkinson's disease: Secondary | ICD-10-CM

## 2013-06-18 DIAGNOSIS — G20A1 Parkinson's disease without dyskinesia, without mention of fluctuations: Secondary | ICD-10-CM

## 2013-06-18 DIAGNOSIS — F329 Major depressive disorder, single episode, unspecified: Secondary | ICD-10-CM

## 2013-06-18 DIAGNOSIS — E1149 Type 2 diabetes mellitus with other diabetic neurological complication: Secondary | ICD-10-CM

## 2013-06-18 DIAGNOSIS — K5900 Constipation, unspecified: Secondary | ICD-10-CM

## 2013-06-18 DIAGNOSIS — I10 Essential (primary) hypertension: Secondary | ICD-10-CM

## 2013-06-25 NOTE — Progress Notes (Signed)
Patient ID: Logan Hernandez, male   DOB: February 09, 1961, 52 y.o.   MRN: [id number]  [place]  No Known Allergies   Chief Complaint  Patient presents with  . Acute Visit    diabetes    HPI:   He is being seen for the management of his diabetes. His am cbg's have been running on the low side and the nursing staff is concerned about the readings. They are wondering if he would benefit from having his insulin lowered to better accommodate his level of intake. The nursing staff staf his afternoon readings are better.   Past Medical History  Diagnosis Date  . Parkinson disease   . Diabetes mellitus   . Ulcer   . Anxiety     Past Surgical History  Procedure Laterality Date  . Cholecystectomy      VITAL SIGNS BP 147/76  Pulse 72  Ht 6\' 1"  (1.854 m)  Wt 143 lb (64.864 kg)  BMI 18.87 kg/m2   Patient's Medications  New Prescriptions   No medications on file  Previous Medications   ASPIRIN EC 81 MG TABLET    Take 81 mg by mouth daily.   CARBIDOPA-LEVODOPA (SINEMET) 10-100 MG PER TABLET    Take 1 tablet by mouth 3 (three) times daily.   CHOLECALCIFEROL (VITAMIN D) 1000 UNITS TABLET    Take 1,000 Units by mouth daily.   DOCUSATE SODIUM (COLACE) 100 MG CAPSULE    Take 100 mg by mouth 2 (two) times daily.   INSULIN ASPART PROTAMINE-INSULIN ASPART (NOVOLOG 70/30) (70-30) 100 UNIT/ML INJECTION    Inject 15-22 Units into the skin 2 (two) times daily with a meal. Take 22 units in the morning and 15 units at night   LISINOPRIL (PRINIVIL,ZESTRIL) 2.5 MG TABLET    Take 2.5 mg by mouth daily.   PRAMIPEXOLE (MIRAPEX) 0.25 MG TABLET    Take 0.25 mg by mouth 3 (three) times daily.   VENLAFAXINE (EFFEXOR) 75 MG TABLET    Take 75 mg by mouth daily.  Modified Medications   No medications on file  Discontinued Medications   No medications on file    SIGNIFICANT DIAGNOSTIC EXAMS    12-04-12: chest x-ray: Shallow inspiraPossible developing right lower lobe airspace disease. 12-05-12: chest  x-ray: Underlying lower lobe predominant chronic interstitial thickening. tion. No evidence of active pulmonary disease.   LABS REVIEWED:   12-02-12: wbc 5,2;hgb 12.9; hct 36.8;mcv 83.6;plt 67; glucose 107; bun 16; creat 0.,66; k+ 4.7 Na++ 140 liver normal albumin 3.9; hiv nr; urine culture: no growth  12-05-12: ammonia 27; iron 67; tibc 218; ferritin 409; folate 104; vit b12: 410  12-27-12: wbc 5.0; hgb 13.7; hct 39.2; mcv 86.2;plt 77; glucose 81; bun 10; creat 0.56; k+4.1; na++139 Liver normal albumin 4.0  01-24-13: wbc 3.7; hgb 12.0; hct 33.7 ;mcv 85.6 ;plt 62; glucose 86; bun 18; creat 0.47; k+3.9; na++135    Review of Systems  Unable to perform ROS   Physical Exam  Constitutional:  Is thin  Neck: Neck supple.  Cardiovascular: Normal rate, regular rhythm and intact distal pulses.   Respiratory: Effort normal and breath sounds normal.  GI: Soft. Bowel sounds are normal.  Musculoskeletal: He exhibits no edema.  Is able to move extremities; has stiffness present   Neurological: He is alert.  Oriented to self  Skin: Skin is warm and dry.  Psychiatric: He is alert today.    ASSESSMENT/ PLAN:   1. Diabetes: he is experiencing hypoglycemia in the am this  does increase his risk for falls; will change his novolog mix 70/30 to 22 units in the am and 10 units in the pm and will continue to monitor his status. Will make further adjustments as indicated.

## 2013-06-26 NOTE — Progress Notes (Signed)
Patient ID: Logan Hernandez, male   DOB: Dec 05, 1960, 52 y.o.   MRN: [id number]  [place]  No Known Allergies   Chief Complaint  Patient presents with  . Medical Managment of Chronic Issues    HPI: He is being seen for the management of his chronic illnesses. He was seen last week for his diabetes; due to low cbg's in the am. His insulin was adjusted which has resolved this issue. He was taken off his seroquel and has done well since that time.  There are no concerns being voiced by the nursing staff today. He is unable to participate in the hpi or ros.   Past Medical History  Diagnosis Date  . Parkinson disease   . Diabetes mellitus   . Ulcer   . Anxiety     Past Surgical History  Procedure Laterality Date  . Cholecystectomy      VITAL SIGNS BP 145/75  Pulse 70  Ht 6\' 1"  (1.854 m)  Wt 143 lb (64.864 kg)  BMI 18.87 kg/m2   Patient's Medications  New Prescriptions   No medications on file  Previous Medications   ASPIRIN EC 81 MG TABLET    Take 81 mg by mouth daily.   CARBIDOPA-LEVODOPA (SINEMET) 10-100 MG PER TABLET    Take 1 tablet by mouth 3 (three) times daily.   CHOLECALCIFEROL (VITAMIN D) 1000 UNITS TABLET    Take 1,000 Units by mouth daily.   DOCUSATE SODIUM (COLACE) 100 MG CAPSULE    Take 100 mg by mouth 2 (two) times daily.   INSULIN ASPART PROTAMINE-INSULIN ASPART (NOVOLOG 70/30) (70-30) 100 UNIT/ML INJECTION    Inject 10-22 Units into the skin 2 (two) times daily with a meal. Take 22 units in the morning and 10 units at night   LISINOPRIL (PRINIVIL,ZESTRIL) 2.5 MG TABLET    Take 2.5 mg by mouth daily.   PRAMIPEXOLE (MIRAPEX) 0.25 MG TABLET    Take 0.25 mg by mouth 3 (three) times daily.   VENLAFAXINE (EFFEXOR) 75 MG TABLET    Take 75 mg by mouth daily.  Modified Medications   No medications on file  Discontinued Medications   No medications on file    SIGNIFICANT DIAGNOSTIC EXAMS     12-04-12: chest x-ray: Shallow inspiraPossible developing right  lower lobe airspace disease. 12-05-12: chest x-ray: Underlying lower lobe predominant chronic interstitial thickening. tion. No evidence of active pulmonary disease.   LABS REVIEWED:   12-02-12: wbc 5,2;hgb 12.9; hct 36.8;mcv 83.6;plt 67; glucose 107; bun 16; creat 0.,66; k+ 4.7 Na++ 140 liver normal albumin 3.9; hiv nr; urine culture: no growth  12-05-12: ammonia 27; iron 67; tibc 218; ferritin 409; folate 104; vit b12: 410  12-27-12: wbc 5.0; hgb 13.7; hct 39.2; mcv 86.2;plt 77; glucose 81; bun 10; creat 0.56; k+4.1; na++139 Liver normal albumin 4.0  01-24-13: wbc 3.7; hgb 12.0; hct 33.7 ;mcv 85.6 ;plt 62; glucose 86; bun 18; creat 0.47; k+3.9; na++135    Review of Systems  Unable to perform ROS   Physical Exam  Constitutional:  Is thin  Neck: Neck supple.  Cardiovascular: Normal rate, regular rhythm and intact distal pulses.   Respiratory: Effort normal and breath sounds normal.  GI: Soft. Bowel sounds are normal.  Musculoskeletal: He exhibits no edema.  Is able to move extremities; has stiffness present   Neurological: He is alert.  Oriented to self  Skin: Skin is warm and dry.  Psychiatric: He is alert today.     ASSESSMENT/ PLAN:  ASSESSMENT AND PLAN  1. Hypertension: stable will continue lisinopril 2.5 mg daily and will  Monitor  2. Parkinson disease: is wihtout change in status; will continue sinemet 10/100 mg three times daily and will continue mirapex 0.25 mg three times dailyand will monitor his status 3. Diabetes type II: will continue novolog mix 70/30: 10 units in the pm and 22 units in the am and will monitor his status and cbg's  4. Constipation: will continue colace twice daily 5. Depression: will continue effexor 75 mg daily  6. Psyhcosis: he is not on medications at this time; is doing well off medications will not make changes will continue to monitor his status.

## 2013-06-27 ENCOUNTER — Encounter: Payer: Self-pay | Admitting: Adult Health

## 2013-06-27 NOTE — Progress Notes (Signed)
Patient ID: Logan Hernandez, male   DOB: [birth year], 52 y.o.   MRN: [id number]  [facility]  No Known Allergies    Chief Complaint  Patient presents with  . Medical Managment of Chronic Issues    HPI: He is being seen for the management of his chronic illnesses.  Overall his status remains unchanged. There are no concerns being voiced by the nursing staff at this time; he is unable to participate in the hpi or ros.   Past Medical History  Diagnosis Date  . Parkinson disease   . Diabetes mellitus   . Ulcer   . Anxiety   . 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 HYPERTENSION, PORTAL 07/06/2007    Qualifier: Diagnosis of  By: Massie Maroon CMA, Tiffany    . Acute respiratory failure with hypoxia 12/02/2012  . HCAP (healthcare-associated pneumonia) 12/02/2012  . CHOLELITHIASIS, ASYMPTOMATIC 05/26/2007    Qualifier: Diagnosis of  By: Delrae Alfred MD, Lanora Manis    . HEPATIC CIRRHOSIS 07/06/2007    Qualifier: Diagnosis of  By: Massie Maroon CMA, Tiffany    . HEPATITIS C 02/21/2009    Annotation: Treatment initiated 10/11/08 and ended 01/16/09 as viral load stopped  responding to treatment.  Possible new treatment modalities to be evaluated  in patient in year's time Qualifier: Diagnosis of  By: Delrae Alfred MD, Lanora Manis    . RIGHT LIVER MASS 05/26/2007    Annotation: 5 mm focus of arterial hyperenhancement --possible early  hepatocellular CA--on MRI felt to be regenerative nodule Qualifier: Diagnosis of  By: Delrae Alfred MD, Lanora Manis    . Type II or unspecified type diabetes mellitus with neurological manifestations, not stated as uncontrolled(250.60) 02/09/2013  . PARKINSON'S DISEASE 03/22/2008    Qualifier: Diagnosis of  By: Delrae Alfred MD, Lanora Manis    . PERIPHERAL NEUROPATHY 04/27/2007    Qualifier: Diagnosis of  By: Reche Dixon MD, Onalee Hua    . Thrombocytopenia 12/02/2012  . ALCOHOL ABUSE, HX OF 07/06/2007    Qualifier: Diagnosis of  By: Massie Maroon CMA, Tiffany    . ANXIETY  09/27/2007    Qualifier: Diagnosis of  By: Gwinda Passe RN, Cloria Spring    . HAMMER TOE 03/06/2009    Qualifier: Diagnosis of  By: Delrae Alfred MD, Lanora Manis    . HYPERLIPIDEMIA 05/16/2007    Qualifier: Diagnosis of  By: Reche Dixon MD, Onalee Hua    . LIVER FUNCTION TESTS, ABNORMAL 01/24/2007    Qualifier: Diagnosis of  By: Reche Dixon MD, Onalee Hua        Past Surgical History  Procedure Laterality Date  . Cholecystectomy     Filed Vitals:   03/16/13 0940  BP: 110/65  Pulse: 70  Height: 6\' 1"  (1.854 m)  Weight: 143 lb (64.864 kg)     Patient's Medications  New Prescriptions   No medications on file  Previous Medications   ASPIRIN EC 81 MG TABLET    Take 81 mg by mouth daily.   CARBIDOPA-LEVODOPA (SINEMET) 10-100 MG PER TABLET    Take 1 tablet by mouth 3 (three) times daily.   CHOLECALCIFEROL (VITAMIN D) 1000 UNITS TABLET    Take 1,000 Units by mouth daily.   DOCUSATE SODIUM (COLACE) 100 MG CAPSULE    Take 100 mg by mouth 2 (two) times daily.   INSULIN ASPART PROTAMINE-INSULIN ASPART (NOVOLOG 70/30) (70-30) 100 UNIT/ML INJECTION    Inject 10-22 Units into the skin 2 (two) times daily with a meal. Take 22 units in the morning and 10 units  at night   LISINOPRIL (PRINIVIL,ZESTRIL) 2.5 MG TABLET    Take 2.5 mg by mouth daily.   PRAMIPEXOLE (MIRAPEX) 0.25 MG TABLET    Take 0.25 mg by mouth 3 (three) times daily.   VENLAFAXINE (EFFEXOR) 75 MG TABLET    Take 75 mg by mouth daily.  Modified Medications   No medications on file  Discontinued Medications   No medications on file    SIGNIFICANT DIAGNOSTIC EXAMS  12-04-12: chest x-ray: Shallow inspiraPossible developing right lower lobe airspace disease. 12-05-12: chest x-ray: Underlying lower lobe predominant chronic interstitial thickening. tion. No evidence of active pulmonary disease.   LABS REVIEWED:   12-02-12: wbc 5,2;hgb 12.9; hct 36.8;mcv 83.6;plt 67; glucose 107; bun 16; creat 0.,66; k+ 4.7 Na++ 140 liver normal albumin 3.9; hiv nr; urine culture: no growth   12-05-12: ammonia 27; iron 67; tibc 218; ferritin 409; folate 104; vit b12: 410  12-27-12: wbc 5.0; hgb 13.7; hct 39.2; mcv 86.2;plt 77; glucose 81; bun 10; creat 0.56; k+4.1; na++139 Liver normal albumin 4.0  01-24-13: wbc 3.7; hgb 12.0; hct 33.7 ;mcv 85.6 ;plt 62; glucose 86; bun 18; creat 0.47; k+3.9; na++135 02-12-13; hgb a1c 5.1     Review of Systems  Unable to perform ROS   Physical Exam  Constitutional:  Is thin  Neck: Neck supple.  Cardiovascular: Normal rate, regular rhythm and intact distal pulses.   Respiratory: Effort normal and breath sounds normal.  GI: Soft. Bowel sounds are normal.  Musculoskeletal: He exhibits no edema.  Is able to move extremities; has stiffness present   Neurological: He is alert.  Oriented to self  Skin: Skin is warm and dry.  Psychiatric: He is alert today.     ASSESSMENT/ PLAN:    ASSESSMENT AND PLAN  1. Hypertension: stable will continue lisinopril 2.5 mg daily asa 81 mg daily and will monitor  2. Parkinson disease: is wihtout change in status; will continue sinemet 10/100 mg three times daily and mirapex 0.25 mg three times daily  and will continue mirapex 0.25 mg three times dailyand will monitor his status 3. Diabetes type II: will continue novolog mix 70/30: 10 units in the pm and 22 units in the am and will monitor his status and cbg's  4. Constipation: will continue colace twice daily 5. Depression: will continue effexor 75 mg daily  6. Psyhcosis: he is not on medications at this time; is doing well off medications will not make changes will continue to monitor his status.

## 2013-07-02 ENCOUNTER — Encounter: Payer: Self-pay | Admitting: Adult Health

## 2013-07-02 NOTE — Progress Notes (Signed)
Patient ID: Logan Hernandez, male   DOB: 1961/06/23, 52 y.o.   MRN: [id number]  [facility]  No Known Allergies   Chief Complaint  Patient presents with  . Medical Managment of Chronic Issues    HPI:  He is being seen for the management of his chronic illnesses. There are no concerns being voiced by the staff at this time. He is unable to participate in the hpi or ros. Overall his status remains without significant change.   Past Medical History  Diagnosis Date  . Parkinson disease   . Diabetes mellitus   . Ulcer   . Anxiety   . 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 HYPERTENSION, PORTAL 07/06/2007    Qualifier: Diagnosis of  By: Massie Maroon CMA, Tiffany    . Acute respiratory failure with hypoxia 12/02/2012  . HCAP (healthcare-associated pneumonia) 12/02/2012  . CHOLELITHIASIS, ASYMPTOMATIC 05/26/2007    Qualifier: Diagnosis of  By: Delrae Alfred MD, Lanora Manis    . HEPATIC CIRRHOSIS 07/06/2007    Qualifier: Diagnosis of  By: Massie Maroon CMA, Tiffany    . HEPATITIS C 02/21/2009    Annotation: Treatment initiated 10/11/08 and ended 01/16/09 as viral load stopped  responding to treatment.  Possible new treatment modalities to be evaluated  in patient in year's time Qualifier: Diagnosis of  By: Delrae Alfred MD, Lanora Manis    . RIGHT LIVER MASS 05/26/2007    Annotation: 5 mm focus of arterial hyperenhancement --possible early  hepatocellular CA--on MRI felt to be regenerative nodule Qualifier: Diagnosis of  By: Delrae Alfred MD, Lanora Manis    . Type II or unspecified type diabetes mellitus with neurological manifestations, not stated as uncontrolled(250.60) 02/09/2013  . PARKINSON'S DISEASE 03/22/2008    Qualifier: Diagnosis of  By: Delrae Alfred MD, Lanora Manis    . PERIPHERAL NEUROPATHY 04/27/2007    Qualifier: Diagnosis of  By: Reche Dixon MD, Onalee Hua    . Thrombocytopenia 12/02/2012  . ALCOHOL ABUSE, HX OF 07/06/2007    Qualifier: Diagnosis of  By: Massie Maroon CMA, Tiffany    .  ANXIETY 09/27/2007    Qualifier: Diagnosis of  By: Gwinda Passe RN, Cloria Spring    . HAMMER TOE 03/06/2009    Qualifier: Diagnosis of  By: Delrae Alfred MD, Lanora Manis    . HYPERLIPIDEMIA 05/16/2007    Qualifier: Diagnosis of  By: Reche Dixon MD, Onalee Hua    . LIVER FUNCTION TESTS, ABNORMAL 01/24/2007    Qualifier: Diagnosis of  By: Reche Dixon MD, Onalee Hua      Past Surgical History  Procedure Laterality Date  . Cholecystectomy      VITAL SIGNS BP 130/86  Pulse 81  Ht 6\' 1"  (1.854 m)  Wt 148 lb (67.132 kg)  BMI 19.53 kg/m2   Patient's Medications  New Prescriptions   No medications on file  Previous Medications   ASPIRIN EC 81 MG TABLET    Take 81 mg by mouth daily.   CARBIDOPA-LEVODOPA (SINEMET) 10-100 MG PER TABLET    Take 1 tablet by mouth 3 (three) times daily.   CHOLECALCIFEROL (VITAMIN D) 1000 UNITS TABLET    Take 1,000 Units by mouth daily.   DOCUSATE SODIUM (COLACE) 100 MG CAPSULE    Take 100 mg by mouth 2 (two) times daily.   INSULIN ASPART PROTAMINE-INSULIN ASPART (NOVOLOG 70/30) (70-30) 100 UNIT/ML INJECTION    Inject 10-22 Units into the skin 2 (two) times daily with a meal. Take 22 units in the morning and 10units at night   LISINOPRIL (  PRINIVIL,ZESTRIL) 2.5 MG TABLET    Take 2.5 mg by mouth daily.   PRAMIPEXOLE (MIRAPEX) 0.25 MG TABLET    Take 0.25 mg by mouth 3 (three) times daily.   VENLAFAXINE (EFFEXOR) 75 MG TABLET    Take 75 mg by mouth daily.  Modified Medications   No medications on file  Discontinued Medications   No medications on file    SIGNIFICANT DIAGNOSTIC EXAMS     SIGNIFICANT DIAGNOSTIC EXAMS  12-04-12: chest x-ray: Shallow inspiraPossible developing right lower lobe airspace disease. 12-05-12: chest x-ray: Underlying lower lobe predominant chronic interstitial thickening. tion. No evidence of active pulmonary disease.   LABS REVIEWED:   12-02-12: wbc 5,2;hgb 12.9; hct 36.8;mcv 83.6;plt 67; glucose 107; bun 16; creat 0.,66; k+ 4.7 Na++ 140 liver normal albumin 3.9; hiv  nr; urine culture: no growth  12-05-12: ammonia 27; iron 67; tibc 218; ferritin 409; folate 104; vit b12: 410  12-27-12: wbc 5.0; hgb 13.7; hct 39.2; mcv 86.2;plt 77; glucose 81; bun 10; creat 0.56; k+4.1; na++139 Liver normal albumin 4.0  01-24-13: wbc 3.7; hgb 12.0; hct 33.7 ;mcv 85.6 ;plt 62; glucose 86; bun 18; creat 0.47; k+3.9; na++135 02-12-13; hgb a1c 5.1     Review of Systems  Unable to perform ROS   Physical Exam  Constitutional:  Is thin  Neck: Neck supple.  Cardiovascular: Normal rate, regular rhythm and intact distal pulses.   Respiratory: Effort normal and breath sounds normal.  GI: Soft. Bowel sounds are normal.  Musculoskeletal: He exhibits no edema.  Is able to move extremities; has stiffness present   Neurological: He is alert.  Oriented to self  Skin: Skin is warm and dry.  Psychiatric: He is alert today.     ASSESSMENT/ PLAN:    ASSESSMENT AND PLAN  1. Hypertension: stable will continue lisinopril 2.5 mg daily asa 81 mg daily and will monitor  2. Parkinson disease: is wihtout change in status; will continue sinemet 10/100 mg three times daily and mirapex 0.25 mg three times daily  and will continue mirapex 0.25 mg three times dailyand will monitor his status 3. Diabetes type II: will continue novolog mix 70/30: 10 units in the pm and 22 units in the am and will monitor his status and cbg's  4. Constipation: will continue colace twice daily 5. Depression: will continue effexor 75 mg daily  6. Psyhcosis: he is not on medications at this time; is doing well off medications will not make changes will continue to monitor his status.

## 2013-07-03 ENCOUNTER — Encounter: Payer: Self-pay | Admitting: Adult Health

## 2013-07-03 MED ORDER — INSULIN ASPART PROT & ASPART (70-30 MIX) 100 UNIT/ML ~~LOC~~ SUSP: 5.0000 [IU] | Freq: Two times a day (BID) | SUBCUTANEOUS | Status: DC

## 2013-07-03 NOTE — Progress Notes (Signed)
Patient ID: Logan Hernandez, male   DOB: 13-Jul-1961, 52 y.o.   MRN: [id number]  [facility]  No Known Allergies   Chief Complaint  Patient presents with  . Medical Managment of Chronic Issues    HPI:  He is being seen for the management of his chronic illnesses. overall his status remains unchanged. The nursing staff has voiced concerns that he is not eating more than 50% of his meals but is receiving novolog mix twice daily; they are wondering if he could tolerate a lower dose. His last hgb a1c is 5.1. He is unable to participate in the hpi or ros   Past Medical History  Diagnosis Date  . Parkinson disease   . Diabetes mellitus   . Ulcer   . Anxiety   . 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 HYPERTENSION, PORTAL 07/06/2007    Qualifier: Diagnosis of  By: Massie Maroon CMA, Tiffany    . Acute respiratory failure with hypoxia 12/02/2012  . HCAP (healthcare-associated pneumonia) 12/02/2012  . CHOLELITHIASIS, ASYMPTOMATIC 05/26/2007    Qualifier: Diagnosis of  By: Delrae Alfred MD, Lanora Manis    . HEPATIC CIRRHOSIS 07/06/2007    Qualifier: Diagnosis of  By: Massie Maroon CMA, Tiffany    . HEPATITIS C 02/21/2009    Annotation: Treatment initiated 10/11/08 and ended 01/16/09 as viral load stopped  responding to treatment.  Possible new treatment modalities to be evaluated  in patient in year's time Qualifier: Diagnosis of  By: Delrae Alfred MD, Lanora Manis    . RIGHT LIVER MASS 05/26/2007    Annotation: 5 mm focus of arterial hyperenhancement --possible early  hepatocellular CA--on MRI felt to be regenerative nodule Qualifier: Diagnosis of  By: Delrae Alfred MD, Lanora Manis    . Type II or unspecified type diabetes mellitus with neurological manifestations, not stated as uncontrolled(250.60) 02/09/2013  . PARKINSON'S DISEASE 03/22/2008    Qualifier: Diagnosis of  By: Delrae Alfred MD, Lanora Manis    . PERIPHERAL NEUROPATHY 04/27/2007    Qualifier: Diagnosis of  By: Reche Dixon MD, Onalee Hua     . Thrombocytopenia 12/02/2012  . ALCOHOL ABUSE, HX OF 07/06/2007    Qualifier: Diagnosis of  By: Massie Maroon CMA, Tiffany    . ANXIETY 09/27/2007    Qualifier: Diagnosis of  By: Gwinda Passe RN, Cloria Spring    . HAMMER TOE 03/06/2009    Qualifier: Diagnosis of  By: Delrae Alfred MD, Lanora Manis    . HYPERLIPIDEMIA 05/16/2007    Qualifier: Diagnosis of  By: Reche Dixon MD, Onalee Hua    . LIVER FUNCTION TESTS, ABNORMAL 01/24/2007    Qualifier: Diagnosis of  By: Reche Dixon MD, Onalee Hua      Past Surgical History  Procedure Laterality Date  . Cholecystectomy      VITAL SIGNS BP 128/74  Pulse 74  Ht 6\' 1"  (1.854 m)  Wt 146 lb (66.225 kg)  BMI 19.27 kg/m2   Patient's Medications  New Prescriptions   No medications on file  Previous Medications   ASPIRIN EC 81 MG TABLET    Take 81 mg by mouth daily.   CARBIDOPA-LEVODOPA (SINEMET) 10-100 MG PER TABLET    Take 1 tablet by mouth 3 (three) times daily.   CHOLECALCIFEROL (VITAMIN D) 1000 UNITS TABLET    Take 1,000 Units by mouth daily.   DOCUSATE SODIUM (COLACE) 100 MG CAPSULE    Take 100 mg by mouth 2 (two) times daily.   INSULIN ASPART PROTAMINE-INSULIN ASPART (NOVOLOG 70/30) (70-30) 100 UNIT/ML INJECTION  Inject 10-22 Units into the skin 2 (two) times daily with a meal. Take 22 units in the morning and 10units at night   LISINOPRIL (PRINIVIL,ZESTRIL) 2.5 MG TABLET    Take 2.5 mg by mouth daily.   PRAMIPEXOLE (MIRAPEX) 0.25 MG TABLET    Take 0.25 mg by mouth 3 (three) times daily.   VENLAFAXINE (EFFEXOR) 75 MG TABLET    Take 75 mg by mouth daily.  Modified Medications   No medications on file  Discontinued Medications   No medications on file    SIGNIFICANT DIAGNOSTIC EXAMS   12-04-12: chest x-ray: Shallow inspiraPossible developing right lower lobe airspace disease. 12-05-12: chest x-ray: Underlying lower lobe predominant chronic interstitial thickening. tion. No evidence of active pulmonary disease.   LABS REVIEWED:   12-02-12: wbc 5,2;hgb 12.9; hct 36.8;mcv  83.6;plt 67; glucose 107; bun 16; creat 0.,66; k+ 4.7 Na++ 140 liver normal albumin 3.9; hiv nr; urine culture: no growth  12-05-12: ammonia 27; iron 67; tibc 218; ferritin 409; folate 104; vit b12: 410  12-27-12: wbc 5.0; hgb 13.7; hct 39.2; mcv 86.2;plt 77; glucose 81; bun 10; creat 0.56; k+4.1; na++139 Liver normal albumin 4.0  01-24-13: wbc 3.7; hgb 12.0; hct 33.7 ;mcv 85.6 ;plt 62; glucose 86; bun 18; creat 0.47; k+3.9; na++135 02-12-13; hgb a1c 5.1     Review of Systems  Unable to perform ROS   Physical Exam  Constitutional:  Is thin  Neck: Neck supple.  Cardiovascular: Normal rate, regular rhythm and intact distal pulses.   Respiratory: Effort normal and breath sounds normal.  GI: Soft. Bowel sounds are normal.  Musculoskeletal: He exhibits no edema.  Is able to move extremities; has stiffness present   Neurological: He is alert.  Oriented to self  Skin: Skin is warm and dry.  Psychiatric: He is alert today.     ASSESSMENT/ PLAN:   1. Hypertension: stable will continue lisinopril 2.5 mg daily asa 81 mg daily and will monitor  2. Parkinson disease: is wihtout change in status; will continue sinemet 10/100 mg three times daily and mirapex 0.25 mg three times daily  and will continue mirapex 0.25 mg three times dailyand will monitor his status 3. Diabetes type II: will  Lower novolog mix 70/30 to  5  units in the pm and 15 units in the am and will monitor his status and cbg's  4. Constipation: will continue colace twice daily 5. Depression: will continue effexor 75 mg daily  6. Psyhcosis: he is not on medications at this time; is doing well off medications will not make changes will continue to monitor his status.    Will check cbc; cmp; hgb a1c next draw

## 2013-08-20 ENCOUNTER — Non-Acute Institutional Stay (SKILLED_NURSING_FACILITY): Payer: Medicaid Other | Admitting: Nurse Practitioner

## 2013-08-20 DIAGNOSIS — E559 Vitamin D deficiency, unspecified: Secondary | ICD-10-CM

## 2013-08-20 DIAGNOSIS — G20A1 Parkinson's disease without dyskinesia, without mention of fluctuations: Secondary | ICD-10-CM

## 2013-08-20 DIAGNOSIS — E1149 Type 2 diabetes mellitus with other diabetic neurological complication: Secondary | ICD-10-CM

## 2013-08-20 DIAGNOSIS — E785 Hyperlipidemia, unspecified: Secondary | ICD-10-CM

## 2013-08-20 DIAGNOSIS — K5900 Constipation, unspecified: Secondary | ICD-10-CM

## 2013-08-20 DIAGNOSIS — F329 Major depressive disorder, single episode, unspecified: Secondary | ICD-10-CM

## 2013-08-20 DIAGNOSIS — G20 Parkinson's disease: Secondary | ICD-10-CM

## 2013-08-20 DIAGNOSIS — I10 Essential (primary) hypertension: Secondary | ICD-10-CM

## 2013-08-20 DIAGNOSIS — F3289 Other specified depressive episodes: Secondary | ICD-10-CM

## 2013-08-20 NOTE — Progress Notes (Signed)
Patient ID: Logan Hernandez, male   DOB: 08/19/1961, 52 y.o.   MRN: [id number]    Nursing Home Location:  [place] of Service: SN[facility] (31)  PCP: Cyndee Brightly, MD  No Known Allergies  Chief Complaint  Patient presents with  . 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 Staff has no concerns at this time.   Review of Systems:  Unable to obtain  Past Medical History  Diagnosis Date  . Parkinson disease   . Diabetes mellitus   . Ulcer   . Anxiety   . 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 HYPERTENSION, PORTAL 07/06/2007    Qualifier: Diagnosis of  By: Leilani Merl CMA, Tiffany    . Acute respiratory failure with hypoxia 12/02/2012  . HCAP (healthcare-associated pneumonia) 12/02/2012  . CHOLELITHIASIS, ASYMPTOMATIC 05/26/2007    Qualifier: Diagnosis of  By: Amil Amen MD, Benjamine Mola    . HEPATIC CIRRHOSIS 07/06/2007    Qualifier: Diagnosis of  By: Leilani Merl CMA, Tiffany    . HEPATITIS C 02/21/2009    Annotation: Treatment initiated 10/11/08 and ended 01/16/09 as viral load stopped  responding to treatment.  Possible new treatment modalities to be evaluated  in patient in year's time Qualifier: Diagnosis of  By: Amil Amen MD, Benjamine Mola    . RIGHT LIVER MASS 05/26/2007    Annotation: 5 mm focus of arterial hyperenhancement --possible early  hepatocellular CA--on MRI felt to be regenerative nodule Qualifier: Diagnosis of  By: Amil Amen MD, Benjamine Mola    . Type II or unspecified type diabetes mellitus with neurological manifestations, not stated as uncontrolled(250.60) 02/09/2013  . PARKINSON'S DISEASE 03/22/2008    Qualifier: Diagnosis of  By: Amil Amen MD, Benjamine Mola    . PERIPHERAL NEUROPATHY 04/27/2007    Qualifier:  Diagnosis of  By: Jobe Igo MD, Shanon Brow    . Thrombocytopenia 12/02/2012  . ALCOHOL ABUSE, HX OF 07/06/2007    Qualifier: Diagnosis of  By: Leilani Merl CMA, Tiffany    . ANXIETY 09/27/2007    Qualifier: Diagnosis of  By: Arsenio Loader RN, Ramond Craver    . HAMMER TOE 03/06/2009    Qualifier: Diagnosis of  By: Amil Amen MD, Benjamine Mola    . HYPERLIPIDEMIA 05/16/2007    Qualifier: Diagnosis of  By: Jobe Igo MD, Shanon Brow    . LIVER FUNCTION TESTS, ABNORMAL 01/24/2007    Qualifier: Diagnosis of  By: Jobe Igo MD, Shanon Brow     Past Surgical History  Procedure Laterality Date  . Cholecystectomy     Social History:   reports that he has never smoked. He has never used smokeless tobacco. He reports that he does not drink alcohol. His drug history is not on file.  No family history on file.  Medications: Patient's Medications  New Prescriptions   No medications on file  Previous Medications   ASPIRIN EC 81 MG TABLET    Take 81 mg by mouth daily.   CARBIDOPA-LEVODOPA (SINEMET) 10-100 MG PER TABLET    Take 1 tablet by mouth 3 (three) times daily.   CHOLECALCIFEROL (VITAMIN D) 1000 UNITS TABLET    Take 1,000 Units by mouth daily.   DOCUSATE SODIUM (COLACE) 100 MG CAPSULE    Take 100 mg by  mouth 2 (two) times daily.   INSULIN ASPART PROTAMINE- ASPART (NOVOLOG MIX 70/30) (70-30) 100 UNIT/ML INJECTION    Inject 0.05-0.15 mLs (5-15 Units total) into the skin 2 (two) times daily with a meal. Take 15 units in the morning and 5 units at night   LISINOPRIL (PRINIVIL,ZESTRIL) 2.5 MG TABLET    Take 2.5 mg by mouth daily.   PRAMIPEXOLE (MIRAPEX) 0.25 MG TABLET    Take 0.25 mg by mouth 3 (three) times daily.   VENLAFAXINE (EFFEXOR) 75 MG TABLET    Take 75 mg by mouth daily.  Modified Medications   No medications on file  Discontinued Medications   No medications on file     Physical Exam: \Physical Exam  Constitutional:  Thin male in NAD; pt with good appetite per nursing.   HENT:  Mouth/Throat: Oropharynx is clear and moist. No  oropharyngeal exudate.  Eyes: Conjunctivae and EOM are normal. Pupils are equal, round, and reactive to light.  Neck: Normal range of motion. Neck supple.  Cardiovascular: Normal rate, regular rhythm and normal heart sounds.   Pulmonary/Chest: Effort normal and breath sounds normal. No respiratory distress.  Abdominal: Soft. Bowel sounds are normal. He exhibits no distension.  Musculoskeletal: He exhibits no edema and no tenderness.  Neurological: He is alert.  Skin: Skin is warm and dry. No erythema.  Psychiatric: Affect normal.     Filed Vitals:   08/20/13 1313  BP: 111/65  Pulse: 69  Temp: 97.8 F (36.6 C)  Resp: 20  Weight: 143 lb (64.864 kg)      Labs reviewed: Basic Metabolic Panel:  Recent Labs  95/62/13 0425 12/04/12 0520 01/24/13 1305  NA 134* 138 135  K 4.6 4.2 3.9  CL 103 105 101  CO2 24 23 25   GLUCOSE 94 82 86  BUN 12 15 18   CREATININE 0.51 0.50 0.47*  CALCIUM 8.9 8.6 8.8   Liver Function Tests:  Recent Labs  12/02/12 1050  AST 40*  ALT 38  ALKPHOS 65  BILITOT 0.8  PROT 7.2  ALBUMIN 3.9   No results found for this basename: LIPASE, AMYLASE,  in the last 8760 hours  Recent Labs  12/05/12 0445  AMMONIA 27   CBC:  Recent Labs  12/02/12 1050  12/04/12 0520 12/05/12 0445 01/24/13 1305  WBC 5.2  < > 2.2* 2.4* 3.7*  NEUTROABS 4.5  --   --   --   --   HGB 12.9*  < > 11.7* 11.7* 12.0*  HCT 36.8*  < > 33.2* 32.4* 33.7*  MCV 83.6  < > 84.9 83.5 85.1  PLT 67*  < > 45* 60* 62*  < > = values in this interval not displayed.  Lab Results  Component Value Date   CHOL 166 12/26/2008   HDL 35* 12/26/2008   LDLCALC 109* 12/26/2008   TRIG 110 12/26/2008   CHOLHDL 4.7 Ratio 12/26/2008  LABS REVIEWED:    12-02-12: wbc 5,2;hgb 12.9; hct 36.8;mcv 83.6;plt 67; glucose 107; bun 16; creat 0.,66; k+ 4.7 Na++ 140 liver normal albumin 3.9; hiv nr; urine culture: no growth   12-05-12: ammonia 27; iron 67; tibc 218; ferritin 409; folate 104; vit b12: 410     12-27-12: wbc 5.0; hgb 13.7; hct 39.2; mcv 86.2;plt 77; glucose 81; bun 10; creat 0.56; k+4.1; na++139 Liver normal albumin 4.0   01-24-13: wbc 3.7; hgb 12.0; hct 33.7 ;mcv 85.6 ;plt 62; glucose 86; bun 18; creat 0.47; k+3.9; na++135 02-12-13; hgb a1c 5.1  06-20-13 wbc 5.5, hgb 13.4, hct 39.2  Sodium 137, potassium 4.1, glucose 99, BUN 13, Cr 0.54, alk phos 62, AST 42, ALT 55 hgb A1c 5.3  Assessment/Plan 1. HYPERTENSION -Patient is stable; continue current regimen. Will monitor and make changes as necessary.  2. CONSTIPATION -stable at this time  3. Type II or unspecified type diabetes mellitus with neurological manifestations, not stated as uncontrolled(250.60) -stable; A1c at goal, no low blood sugars, will cont current medications, will decrease CBGs to BID when giving insulin  4. PARKINSON'S DISEASE -stable cont sinemet and mirapex  5. DEPRESSION -conts effexor; mood has been stable  6. Unspecified vitamin D deficiency -will follow up vit D level, currently on vit d 1000 units   7. HYPERLIPIDEMIA -will get fast lipids

## 2013-09-11 ENCOUNTER — Non-Acute Institutional Stay (SKILLED_NURSING_FACILITY): Payer: Medicaid Other | Admitting: Internal Medicine

## 2013-09-11 DIAGNOSIS — R1314 Dysphagia, pharyngoesophageal phase: Secondary | ICD-10-CM

## 2013-09-11 DIAGNOSIS — G20 Parkinson's disease: Secondary | ICD-10-CM

## 2013-09-13 NOTE — Progress Notes (Addendum)
Patient ID: Logan Hernandez, male   DOB: 1960-11-19, 53 y.o.   MRN: [id number]               PROGRESS NOTE  DATE:  09/11/2013    FACILITY: [facility]    LEVEL OF CARE:   SNF   Acute Visit   CHIEF COMPLAINT:  Review of swallowing difficulties.    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  He recently underwent a FEES swallowing study that showed severe oropharyngeal dysphagia with an esophageal component.   It was recommended that he be NPO.  However,  apparently this has already been discussed with family and they wish to continue with the safest mode of p.o. intake.  He is DNR.  He was most recently in the hospital in late March 2014 at which time he had an aspiration pneumonia, although I do not see this recently.   In discussing things with staff, he  apparently eats well.  However, he does gurgle.    CURRENT MEDICATIONS:  Medication list is reviewed.     He is on 70/30 insulin 15 U every morning and evening.     He is on nutritional supplement.    Effexor 75 q.d.    Vitamin D3, 1000 q.d.    Enteric-coated aspirin 81 q.d.    Prinivil 2.5 q.d.    Colace 100 b.i.d.    Mirapex 0.25 three times a day.    Sinemet 10/100, 1 tablet three times a day.    PHYSICAL EXAMINATION:   CARDIOVASCULAR:  CARDIAC:   Heart sounds are normal.  He appears to be euvolemic.   CHEST/RESPIRATORY:  Clear air entry bilaterally.   GASTROINTESTINAL:  LIVER/SPLEEN/KIDNEYS:  No liver, no spleen.  No tenderness.   NEUROLOGICAL:   He tends to lean to the right, sitting in his chair.    ASSESSMENT/PLAN:  Parkinson's disease with Parkinson's dementia complex.  This is advanced and, no doubt, the cause of his dysphagia.  He is at risk in continuing to eat due to possible aspiration.  However,  apparently the family has made a conscious decision to continue to feed him.  I had previously had this discussion with them  earlier last year.  I think they came to the same conclusions.  I do not think there is much more we can do by increasing his Parkinson's medications.  He does not have significant tone in his arms.    Type 2 diabetes.  On insulin.  He has known neuropathy and microvascular disease.  We will recheck a hemoglobin A1c.  His CBGs appear to be very well controlled.  In fact, his insulin may be able to be reduced.    CPT CODE: 40347

## 2013-09-17 ENCOUNTER — Non-Acute Institutional Stay (SKILLED_NURSING_FACILITY): Payer: Medicaid Other | Admitting: Nurse Practitioner

## 2013-09-17 DIAGNOSIS — K5900 Constipation, unspecified: Secondary | ICD-10-CM

## 2013-09-17 DIAGNOSIS — E559 Vitamin D deficiency, unspecified: Secondary | ICD-10-CM

## 2013-09-17 DIAGNOSIS — G20A1 Parkinson's disease without dyskinesia, without mention of fluctuations: Secondary | ICD-10-CM

## 2013-09-17 DIAGNOSIS — F3289 Other specified depressive episodes: Secondary | ICD-10-CM

## 2013-09-17 DIAGNOSIS — I10 Essential (primary) hypertension: Secondary | ICD-10-CM

## 2013-09-17 DIAGNOSIS — G20 Parkinson's disease: Secondary | ICD-10-CM

## 2013-09-17 DIAGNOSIS — F329 Major depressive disorder, single episode, unspecified: Secondary | ICD-10-CM

## 2013-09-17 DIAGNOSIS — E1149 Type 2 diabetes mellitus with other diabetic neurological complication: Secondary | ICD-10-CM

## 2013-09-17 NOTE — Progress Notes (Signed)
Patient ID: Logan Hernandez, male   DOB: 08/19/1961, 53 y.o.   MRN: [id number]    Nursing Home Location:  [place] of Service: SNF (31)  PCP: Cyndee Brightly, MD  No Known Allergies  Chief Complaint  Patient presents with  . Medical Managment of Chronic Issues    HPI:  53 year old who is a LTR of [facility] is being seen today for routine follow up; pt with pmh of parkinson disease, DM, anxiety, depression, constipation and vit d def; pt unable to contribute to ROS or HPI. Staff has no concerns at this time.   Review of Systems:  Unable to obtain  Past Medical History  Diagnosis Date  . Parkinson disease   . Diabetes mellitus   . Ulcer   . Anxiety   . 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 HYPERTENSION, PORTAL 07/06/2007    Qualifier: Diagnosis of  By: Leilani Merl CMA, Tiffany    . Acute respiratory failure with hypoxia 12/02/2012  . HCAP (healthcare-associated pneumonia) 12/02/2012  . CHOLELITHIASIS, ASYMPTOMATIC 05/26/2007    Qualifier: Diagnosis of  By: Amil Amen MD, Benjamine Mola    . HEPATIC CIRRHOSIS 07/06/2007    Qualifier: Diagnosis of  By: Leilani Merl CMA, Tiffany    . HEPATITIS C 02/21/2009    Annotation: Treatment initiated 10/11/08 and ended 01/16/09 as viral load stopped  responding to treatment.  Possible new treatment modalities to be evaluated  in patient in year's time Qualifier: Diagnosis of  By: Amil Amen MD, Benjamine Mola    . RIGHT LIVER MASS 05/26/2007    Annotation: 5 mm focus of arterial hyperenhancement --possible early  hepatocellular CA--on MRI felt to be regenerative nodule Qualifier: Diagnosis of  By: Amil Amen MD, Benjamine Mola    . Type II or unspecified type diabetes mellitus with neurological manifestations, not stated as uncontrolled(250.60) 02/09/2013  . PARKINSON'S DISEASE 03/22/2008    Qualifier: Diagnosis of  By: Amil Amen MD, Benjamine Mola    . PERIPHERAL NEUROPATHY 04/27/2007    Qualifier:  Diagnosis of  By: Jobe Igo MD, Shanon Brow    . Thrombocytopenia 12/02/2012  . ALCOHOL ABUSE, HX OF 07/06/2007    Qualifier: Diagnosis of  By: Leilani Merl CMA, Tiffany    . ANXIETY 09/27/2007    Qualifier: Diagnosis of  By: Arsenio Loader RN, Ramond Craver    . HAMMER TOE 03/06/2009    Qualifier: Diagnosis of  By: Amil Amen MD, Benjamine Mola    . HYPERLIPIDEMIA 05/16/2007    Qualifier: Diagnosis of  By: Jobe Igo MD, Shanon Brow    . LIVER FUNCTION TESTS, ABNORMAL 01/24/2007    Qualifier: Diagnosis of  By: Jobe Igo MD, Shanon Brow     Past Surgical History  Procedure Laterality Date  . Cholecystectomy     Social History:   reports that he has never smoked. He has never used smokeless tobacco. He reports that he does not drink alcohol. His drug history is not on file.  No family history on file.  Medications: Patient's Medications  New Prescriptions   No medications on file  Previous Medications   ASPIRIN EC 81 MG TABLET    Take 81 mg by mouth daily.   CARBIDOPA-LEVODOPA (SINEMET) 10-100 MG PER TABLET    Take 1 tablet by mouth 3 (three) times daily.   CHOLECALCIFEROL (VITAMIN D) 1000 UNITS TABLET    Take 1,000 Units by mouth daily.   DOCUSATE SODIUM (COLACE) 100 MG CAPSULE    Take 100 mg by  mouth 2 (two) times daily.   INSULIN ASPART PROTAMINE- ASPART (NOVOLOG MIX 70/30) (70-30) 100 UNIT/ML INJECTION    Inject 0.05-0.15 mLs (5-15 Units total) into the skin 2 (two) times daily with a meal. Take 15 units in the morning and 5 units at night   LISINOPRIL (PRINIVIL,ZESTRIL) 2.5 MG TABLET    Take 2.5 mg by mouth daily.   PRAMIPEXOLE (MIRAPEX) 0.25 MG TABLET    Take 0.25 mg by mouth 3 (three) times daily.   VENLAFAXINE (EFFEXOR) 75 MG TABLET    Take 75 mg by mouth daily.  Modified Medications   No medications on file  Discontinued Medications   No medications on file     Physical Exam: Physical Exam  Constitutional:  Thin male in NAD; pt with good appetite per nursing.   HENT:  Nose: Nose normal.  Mouth/Throat: Oropharynx is clear  and moist. No oropharyngeal exudate.  Eyes: Conjunctivae and EOM are normal. Pupils are equal, round, and reactive to light.  Neck: Normal range of motion. Neck supple.  Cardiovascular: Normal rate, regular rhythm and normal heart sounds.   Pulmonary/Chest: Effort normal and breath sounds normal. No respiratory distress.  Abdominal: Soft. Bowel sounds are normal. He exhibits no distension.  Musculoskeletal: He exhibits no edema and no tenderness.  Neurological: He is alert.  Skin: Skin is warm and dry. No erythema.  Psychiatric: Affect normal.    Filed Vitals:   09/17/13 1233  BP: 122/68  Pulse: 76  Temp: 98 F (36.7 C)  Resp: 20  SpO2: 98%      Labs reviewed: Basic Metabolic Panel:  Recent Labs  12/03/12 0425 12/04/12 0520 01/24/13 1305  NA 134* 138 135  K 4.6 4.2 3.9  CL 103 105 101  CO2 '24 23 25  ' GLUCOSE 94 82 86  BUN '12 15 18  ' CREATININE 0.51 0.50 0.47*  CALCIUM 8.9 8.6 8.8   Liver Function Tests:  Recent Labs  12/02/12 1050  AST 40*  ALT 38  ALKPHOS 65  BILITOT 0.8  PROT 7.2  ALBUMIN 3.9   No results found for this basename: LIPASE, AMYLASE,  in the last 8760 hours  Recent Labs  12/05/12 0445  AMMONIA 27   CBC:  Recent Labs  12/02/12 1050  12/04/12 0520 12/05/12 0445 01/24/13 1305  WBC 5.2  < > 2.2* 2.4* 3.7*  NEUTROABS 4.5  --   --   --   --   HGB 12.9*  < > 11.7* 11.7* 12.0*  HCT 36.8*  < > 33.2* 32.4* 33.7*  MCV 83.6  < > 84.9 83.5 85.1  PLT 67*  < > 45* 60* 62*  < > = values in this interval not displayed. 12-02-12: wbc 5,2;hgb 12.9; hct 36.8;mcv 83.6;plt 67; glucose 107; bun 16; creat 0.,66; k+ 4.7  Na++ 140 liver normal albumin 3.9; hiv nr; urine culture: no growth  12-05-12: ammonia 27; iron 67; tibc 218; ferritin 409; folate 104; vit b12: 410  12-27-12: wbc 5.0; hgb 13.7; hct 39.2; mcv 86.2;plt 77; glucose 81; bun 10; creat 0.56; k+4.1; na++139  Liver normal albumin 4.0  01-24-13: wbc 3.7; hgb 12.0; hct 33.7 ;mcv 85.6 ;plt 62;  glucose 86; bun 18; creat 0.47; k+3.9; na++135  02-12-13; hgb a1c 5.1  06-20-13 wbc 5.5, hgb 13.4, hct 39.2  Sodium 137, potassium 4.1, glucose 99, BUN 13, Cr 0.54, alk phos 62, AST 42, ALT 55  hgb A1c 5.3 Lipid Profile    Result: 08/22/2013 10:20 AM   (  Status: F )     C Cholesterol 142     0-200 mg/dL SLN C Triglyceride 70     <150 mg/dL SLN   HDL Cholesterol 37   L >39 mg/dL SLN   Total Chol/HDL Ratio 3.8      Ratio SLN   VLDL Cholesterol (Calc) 14     0-40 mg/dL SLN   LDL Cholesterol (Calc) 91     0-99 mg/dL SLN C Vitamin D (25-Hydroxy)    Result: 08/23/2013 2:15 AM   ( Status: F )       Vitamin D (25-Hydroxy) 34     30-89 ng/mL SLN C  Comprehensive Metabolic Panel    Result: 09/17/2013 11:00 AM   ( Status: P )     C Sodium 137     135-145 mEq/L SLN   Potassium 4.2     3.5-5.3 mEq/L SLN   Chloride 103     96-112 mEq/L SLN   CO2 20     19-32 mEq/L SLN   Glucose 76     70-99 mg/dL SLN   BUN 13     6-23 mg/dL SLN   Creatinine 0.58     0.50-1.35 mg/dL SLN   Bilirubin, Total 0.7     0.3-1.2 mg/dL SLN   Alkaline Phosphatase 65     39-117 U/L SLN   AST/SGOT 47   H 0-37 U/L SLN   ALT/SGPT 53     0-53 U/L SLN   Total Protein 7.0     6.0-8.3 g/dL SLN   Albumin 3.7     3.5-5.2 g/dL SLN   Calcium 8.8     Assessment/Plan 1. HYPERTENSION -remains stable on lisinopril  2. CONSTIPATION -conts on colace, without complaints  3. Type II or unspecified type diabetes mellitus with neurological manifestations, not stated as uncontrolled(250.60) -blood sugars well controlled on current 70/30 dose;a1C is pending but at goal in October, and dose was reduced     4. PARKINSON'S DISEASE -stable on pramipexole and sinemet   5. DEPRESSION -mood stable on current medication  6. Unspecified vitamin D deficiency - taking vit d 1000 units daily; will increase this to 2000 units daily

## 2013-10-08 ENCOUNTER — Non-Acute Institutional Stay (SKILLED_NURSING_FACILITY): Payer: Medicaid Other | Admitting: Nurse Practitioner

## 2013-10-08 DIAGNOSIS — E559 Vitamin D deficiency, unspecified: Secondary | ICD-10-CM

## 2013-10-08 DIAGNOSIS — E1149 Type 2 diabetes mellitus with other diabetic neurological complication: Secondary | ICD-10-CM

## 2013-10-08 DIAGNOSIS — F3289 Other specified depressive episodes: Secondary | ICD-10-CM

## 2013-10-08 DIAGNOSIS — K5900 Constipation, unspecified: Secondary | ICD-10-CM

## 2013-10-08 DIAGNOSIS — F329 Major depressive disorder, single episode, unspecified: Secondary | ICD-10-CM

## 2013-10-08 DIAGNOSIS — I10 Essential (primary) hypertension: Secondary | ICD-10-CM

## 2013-10-08 DIAGNOSIS — G20 Parkinson's disease: Secondary | ICD-10-CM

## 2013-10-08 NOTE — Progress Notes (Signed)
Patient ID: Logan Hernandez, male   DOB: 04-30-1961, 53 y.o.   MRN: [id number]    Nursing Home Location:  [place] of Service: SNF (31)  PCP: Cyndee Brightly, MD  No Known Allergies  Chief Complaint  Patient presents with  . Medical Managment of Chronic Issues    HPI:  53 year old who is a LTR of [facility] is being seen today for routine follow up on chronic conditions; pt with pmh of parkinson disease, DM, anxiety, depression, constipation and vit d def; pt unable to contribute to ROS or HPI. Staff reports frequent low blood sugars despite good PO intake; otherwise doing well   Review of Systems:  Review of Systems  Unable to perform ROS: patient nonverbal     Past Medical History  Diagnosis Date  . Parkinson disease   . Diabetes mellitus   . Ulcer   . Anxiety   . 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 HYPERTENSION, PORTAL 07/06/2007    Qualifier: Diagnosis of  By: Leilani Merl CMA, Tiffany    . Acute respiratory failure with hypoxia 12/02/2012  . HCAP (healthcare-associated pneumonia) 12/02/2012  . CHOLELITHIASIS, ASYMPTOMATIC 05/26/2007    Qualifier: Diagnosis of  By: Amil Amen MD, Benjamine Mola    . HEPATIC CIRRHOSIS 07/06/2007    Qualifier: Diagnosis of  By: Leilani Merl CMA, Tiffany    . HEPATITIS C 02/21/2009    Annotation: Treatment initiated 10/11/08 and ended 01/16/09 as viral load stopped  responding to treatment.  Possible new treatment modalities to be evaluated  in patient in year's time Qualifier: Diagnosis of  By: Amil Amen MD, Benjamine Mola    . RIGHT LIVER MASS 05/26/2007    Annotation: 5 mm focus of arterial hyperenhancement --possible early  hepatocellular CA--on MRI felt to be regenerative nodule Qualifier: Diagnosis of  By: Amil Amen MD, Benjamine Mola    . Type II or unspecified type diabetes mellitus with neurological manifestations, not stated as uncontrolled(250.60) 02/09/2013  . PARKINSON'S DISEASE  03/22/2008    Qualifier: Diagnosis of  By: Amil Amen MD, Benjamine Mola    . PERIPHERAL NEUROPATHY 04/27/2007    Qualifier: Diagnosis of  By: Jobe Igo MD, Shanon Brow    . Thrombocytopenia 12/02/2012  . ALCOHOL ABUSE, HX OF 07/06/2007    Qualifier: Diagnosis of  By: Leilani Merl CMA, Tiffany    . ANXIETY 09/27/2007    Qualifier: Diagnosis of  By: Arsenio Loader RN, Ramond Craver    . HAMMER TOE 03/06/2009    Qualifier: Diagnosis of  By: Amil Amen MD, Benjamine Mola    . HYPERLIPIDEMIA 05/16/2007    Qualifier: Diagnosis of  By: Jobe Igo MD, Shanon Brow    . LIVER FUNCTION TESTS, ABNORMAL 01/24/2007    Qualifier: Diagnosis of  By: Jobe Igo MD, Shanon Brow     Past Surgical History  Procedure Laterality Date  . Cholecystectomy     Social History:   reports that he has never smoked. He has never used smokeless tobacco. He reports that he does not drink alcohol. His drug history is not on file.  No family history on file.  Medications: Patient's Medications  New Prescriptions   No medications on file  Previous Medications   ASPIRIN EC 81 MG TABLET    Take 81 mg by mouth daily.   CARBIDOPA-LEVODOPA (SINEMET) 10-100 MG PER TABLET    Take 1 tablet by mouth 3 (three) times daily.   CHOLECALCIFEROL (VITAMIN D) 1000 UNITS TABLET    Take 2,000  Units by mouth daily.    DOCUSATE SODIUM (COLACE) 100 MG CAPSULE    Take 100 mg by mouth 2 (two) times daily.   INSULIN ASPART PROTAMINE- ASPART (NOVOLOG MIX 70/30) (70-30) 100 UNIT/ML INJECTION    Inject 0.05-0.15 mLs (5-15 Units total) into the skin 2 (two) times daily with a meal. Take 15 units in the morning and 5 units at night   LISINOPRIL (PRINIVIL,ZESTRIL) 2.5 MG TABLET    Take 2.5 mg by mouth daily.   PRAMIPEXOLE (MIRAPEX) 0.25 MG TABLET    Take 0.25 mg by mouth 3 (three) times daily.   VENLAFAXINE (EFFEXOR) 75 MG TABLET    Take 75 mg by mouth daily.  Modified Medications   No medications on file  Discontinued Medications   No medications on file     Physical Exam:  Filed Vitals:   10/08/13 1148    BP: 93/60  Pulse: 70  Temp: 98.2 F (36.8 C)  Resp: 20    Physical Exam  Constitutional:  Thin male in NAD   HENT:  Mouth/Throat: Oropharynx is clear and moist. No oropharyngeal exudate.  Eyes: Conjunctivae and EOM are normal. Pupils are equal, round, and reactive to light.  Neck: Normal range of motion. Neck supple. No thyromegaly present.  Cardiovascular: Normal rate, regular rhythm and normal heart sounds.   Pulmonary/Chest: Effort normal and breath sounds normal. No respiratory distress.  Abdominal: Soft. Bowel sounds are normal. He exhibits no distension.  Musculoskeletal: He exhibits no edema and no tenderness.  In wheelchair   Lymphadenopathy:    He has no cervical adenopathy.  Neurological: He is alert.  Skin: Skin is warm and dry. No erythema.  Psychiatric: Affect normal.     Labs reviewed: Basic Metabolic Panel:  Recent Labs  12/03/12 0425 12/04/12 0520 01/24/13 1305  NA 134* 138 135  K 4.6 4.2 3.9  CL 103 105 101  CO2 '24 23 25  ' GLUCOSE 94 82 86  BUN '12 15 18  ' CREATININE 0.51 0.50 0.47*  CALCIUM 8.9 8.6 8.8   Liver Function Tests:  Recent Labs  12/02/12 1050  AST 40*  ALT 38  ALKPHOS 65  BILITOT 0.8  PROT 7.2  ALBUMIN 3.9   No results found for this basename: LIPASE, AMYLASE,  in the last 8760 hours  Recent Labs  12/05/12 0445  AMMONIA 27   CBC:  Recent Labs  12/02/12 1050  12/04/12 0520 12/05/12 0445 01/24/13 1305  WBC 5.2  < > 2.2* 2.4* 3.7*  NEUTROABS 4.5  --   --   --   --   HGB 12.9*  < > 11.7* 11.7* 12.0*  HCT 36.8*  < > 33.2* 32.4* 33.7*  MCV 83.6  < > 84.9 83.5 85.1  PLT 67*  < > 45* 60* 62*  < > = values in this interval not displayed. Cardiac Enzymes: No results found for this basename: CKTOTAL, CKMB, CKMBINDEX, TROPONINI,  in the last 8760 hours BNP: No components found with this basename: POCBNP,  CBG:  Recent Labs  12/05/12 0620 12/05/12 1203 12/05/12 1731  GLUCAP 94 132* 89   12-02-12: wbc 5,2;hgb  12.9; hct 36.8;mcv 83.6;plt 67; glucose 107; bun 16; creat 0.,66; k+ 4.7  Na++ 140 liver normal albumin 3.9; hiv nr; urine culture: no growth  12-05-12: ammonia 27; iron 67; tibc 218; ferritin 409; folate 104; vit b12: 410  12-27-12: wbc 5.0; hgb 13.7; hct 39.2; mcv 86.2;plt 77; glucose 81; bun 10; creat 0.56; k+4.1; na++139  Liver normal albumin 4.0  01-24-13: wbc 3.7; hgb 12.0; hct 33.7 ;mcv 85.6 ;plt 62; glucose 86; bun 18; creat 0.47; k+3.9; na++135  02-12-13; hgb a1c 5.1  06-20-13 wbc 5.5, hgb 13.4, hct 39.2  Sodium 137, potassium 4.1, glucose 99, BUN 13, Cr 0.54, alk phos 62, AST 42, ALT 55  hgb A1c 5.3  Lipid Profile  Result: 08/22/2013 10:20 AM ( Status: F ) C  Cholesterol 142 0-200 mg/dL SLN C  Triglyceride 70 <150 mg/dL SLN  HDL Cholesterol 37 L >39 mg/dL SLN  Total Chol/HDL Ratio 3.8 Ratio SLN  VLDL Cholesterol (Calc) 14 0-40 mg/dL SLN  LDL Cholesterol (Calc) 91 0-99 mg/dL SLN C  Vitamin D (25-Hydroxy)  Result: 08/23/2013 2:15 AM ( Status: F )  Vitamin D (25-Hydroxy) 34 30-89 ng/mL SLN C  Comprehensive Metabolic Panel  Result: 2/54/8323 11:00 AM ( Status: P ) C  Sodium 137 135-145 mEq/L SLN  Potassium 4.2 3.5-5.3 mEq/L SLN  Chloride 103 96-112 mEq/L SLN  CO2 20 19-32 mEq/L SLN  Glucose 76 70-99 mg/dL SLN  BUN 13 6-23 mg/dL SLN  Creatinine 0.58 0.50-1.35 mg/dL SLN  Bilirubin, Total 0.7 0.3-1.2 mg/dL SLN  Alkaline Phosphatase 65 39-117 U/L SLN  AST/SGOT 47 H 0-37 U/L SLN  ALT/SGPT 53 0-53 U/L SLN  Total Protein 7.0 6.0-8.3 g/dL SLN  Albumin 3.7 3.5-5.2 g/dL SLN  Calcium 8.8    Assessment/Plan 1. Unspecified vitamin D deficiency -conts on vit D supplements  2. CONSTIPATION -Patient is stable; continue current regimen. Will monitor and make changes as necessary.  3. Type II or unspecified type diabetes mellitus with neurological manifestations, not stated as uncontrolled(250.60) -nursing notes pt with low blood sugars; having to hold novolog 70/30; last A1c in jan 2015  was 5.3 -will dc all insulin at this time and cont to monitor CBGs   4. PARKINSON'S DISEASE Stable; on swallowing precaution; nectar thick  -conts on sinemet and mirapex   5. DEPRESSION -pt on Effexor;  Mood is good per nursing, seems happy during visit; will cont current medication

## 2013-11-05 ENCOUNTER — Non-Acute Institutional Stay (SKILLED_NURSING_FACILITY): Payer: Medicaid Other | Admitting: Nurse Practitioner

## 2013-11-05 DIAGNOSIS — I10 Essential (primary) hypertension: Secondary | ICD-10-CM

## 2013-11-05 DIAGNOSIS — E1149 Type 2 diabetes mellitus with other diabetic neurological complication: Secondary | ICD-10-CM

## 2013-11-05 DIAGNOSIS — G20 Parkinson's disease: Secondary | ICD-10-CM

## 2013-11-05 DIAGNOSIS — E559 Vitamin D deficiency, unspecified: Secondary | ICD-10-CM

## 2013-11-05 DIAGNOSIS — K5900 Constipation, unspecified: Secondary | ICD-10-CM

## 2013-11-05 DIAGNOSIS — R634 Abnormal weight loss: Secondary | ICD-10-CM

## 2013-11-05 MED ORDER — MIRTAZAPINE 7.5 MG PO TABS: 15.0000 mg | ORAL_TABLET | Freq: Every day | ORAL | Status: AC

## 2013-11-05 NOTE — Progress Notes (Signed)
Patient ID: Logan Hernandez, male   DOB: 06/26/1961, 53 y.o.   MRN: [id number]    Nursing Home Location:  [place] of Service: [facility] (31)  PCP: Cyndee Brightly, MD  No Known Allergies  Chief Complaint  Patient presents with  . Medical Managment of Chronic Issues    HPI:  53 year old who is a LTR of [facility] is being seen today for routine follow up on chronic conditions; pt with pmh of parkinson disease, DM, anxiety, depression, constipation and vit d def; pt unable to contribute to ROS or HPI. Pt with ongoing wt loss, currently he has double portions to tray and supplements.  Review of Systems:  Unable to obtain   Past Medical History  Diagnosis Date  . Parkinson disease   . Diabetes mellitus   . Ulcer   . Anxiety   . 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 HYPERTENSION, PORTAL 07/06/2007    Qualifier: Diagnosis of  By: Leilani Merl CMA, Tiffany    . Acute respiratory failure with hypoxia 12/02/2012  . HCAP (healthcare-associated pneumonia) 12/02/2012  . CHOLELITHIASIS, ASYMPTOMATIC 05/26/2007    Qualifier: Diagnosis of  By: Amil Amen MD, Benjamine Mola    . HEPATIC CIRRHOSIS 07/06/2007    Qualifier: Diagnosis of  By: Leilani Merl CMA, Tiffany    . HEPATITIS C 02/21/2009    Annotation: Treatment initiated 10/11/08 and ended 01/16/09 as viral load stopped  responding to treatment.  Possible new treatment modalities to be evaluated  in patient in year's time Qualifier: Diagnosis of  By: Amil Amen MD, Benjamine Mola    . RIGHT LIVER MASS 05/26/2007    Annotation: 5 mm focus of arterial hyperenhancement --possible early  hepatocellular CA--on MRI felt to be regenerative nodule Qualifier: Diagnosis of  By: Amil Amen MD, Benjamine Mola    . Type II or unspecified type diabetes mellitus with neurological manifestations, not stated as uncontrolled(250.60) 02/09/2013  . PARKINSON'S DISEASE 03/22/2008    Qualifier: Diagnosis of  By: Amil Amen  MD, Benjamine Mola    . PERIPHERAL NEUROPATHY 04/27/2007    Qualifier: Diagnosis of  By: Jobe Igo MD, Shanon Brow    . Thrombocytopenia 12/02/2012  . ALCOHOL ABUSE, HX OF 07/06/2007    Qualifier: Diagnosis of  By: Leilani Merl CMA, Tiffany    . ANXIETY 09/27/2007    Qualifier: Diagnosis of  By: Arsenio Loader RN, Ramond Craver    . HAMMER TOE 03/06/2009    Qualifier: Diagnosis of  By: Amil Amen MD, Benjamine Mola    . HYPERLIPIDEMIA 05/16/2007    Qualifier: Diagnosis of  By: Jobe Igo MD, Shanon Brow    . LIVER FUNCTION TESTS, ABNORMAL 01/24/2007    Qualifier: Diagnosis of  By: Jobe Igo MD, Shanon Brow     Past Surgical History  Procedure Laterality Date  . Cholecystectomy     Social History:   reports that he has never smoked. He has never used smokeless tobacco. He reports that he does not drink alcohol. His drug history is not on file.  No family history on file.  Medications: Patient's Medications  New Prescriptions   No medications on file  Previous Medications   ASPIRIN EC 81 MG TABLET    Take 81 mg by mouth daily.   CARBIDOPA-LEVODOPA (SINEMET) 10-100 MG PER TABLET    Take 1 tablet by mouth 3 (three) times daily.   CHOLECALCIFEROL (VITAMIN D) 1000 UNITS TABLET    Take 2,000 Units by mouth daily.    DOCUSATE SODIUM (  COLACE) 100 MG CAPSULE    Take 100 mg by mouth 2 (two) times daily.   LISINOPRIL (PRINIVIL,ZESTRIL) 2.5 MG TABLET    Take 2.5 mg by mouth daily.   PRAMIPEXOLE (MIRAPEX) 0.25 MG TABLET    Take 0.25 mg by mouth 3 (three) times daily.   VENLAFAXINE (EFFEXOR) 75 MG TABLET    Take 75 mg by mouth daily.  Modified Medications   No medications on file  Discontinued Medications   INSULIN ASPART PROTAMINE- ASPART (NOVOLOG MIX 70/30) (70-30) 100 UNIT/ML INJECTION    Inject 0.05-0.15 mLs (5-15 Units total) into the skin 2 (two) times daily with a meal. Take 15 units in the morning and 5 units at night     Physical Exam:  Filed Vitals:   11/05/13 1249  BP: 107/62  Pulse: 80  Temp: 97 F (36.1 C)  Resp: 20  Weight: 132 lb  (59.875 kg)    Physical Exam  Constitutional:  Thin male in NAD  HENT:  Nose: Nose normal.  Mouth/Throat: Oropharynx is clear and moist. No oropharyngeal exudate.  Eyes: Conjunctivae and EOM are normal. Pupils are equal, round, and reactive to light.  Neck: Normal range of motion. Neck supple.  Cardiovascular: Normal rate, regular rhythm and normal heart sounds.   Pulmonary/Chest: Effort normal and breath sounds normal. No respiratory distress.  Abdominal: Soft. Bowel sounds are normal. He exhibits no distension.  Musculoskeletal: He exhibits no edema and no tenderness.  [facility] in [facility]  Neurological: He is alert.  Skin: Skin is warm and dry. No erythema.  Psychiatric: Affect normal.     Labs reviewed: Basic Metabolic Panel:  Recent Labs  12/03/12 0425 12/04/12 0520 01/24/13 1305  NA 134* 138 135  K 4.6 4.2 3.9  CL 103 105 101  CO2 '24 23 25  ' GLUCOSE 94 82 86  BUN '12 15 18  ' CREATININE 0.51 0.50 0.47*  CALCIUM 8.9 8.6 8.8   Liver Function Tests:  Recent Labs  12/02/12 1050  AST 40*  ALT 38  ALKPHOS 65  BILITOT 0.8  PROT 7.2  ALBUMIN 3.9   No results found for this basename: LIPASE, AMYLASE,  in the last 8760 hours  Recent Labs  12/05/12 0445  AMMONIA 27   CBC:  Recent Labs  12/02/12 1050  12/04/12 0520 12/05/12 0445 01/24/13 1305  WBC 5.2  < > 2.2* 2.4* 3.7*  NEUTROABS 4.5  --   --   --   --   HGB 12.9*  < > 11.7* 11.7* 12.0*  HCT 36.8*  < > 33.2* 32.4* 33.7*  MCV 83.6  < > 84.9 83.5 85.1  PLT 67*  < > 45* 60* 62*  < > = values in this interval not displayed.  12-02-12: wbc 5,2;hgb 12.9; hct 36.8;mcv 83.6;plt 67; glucose 107; bun 16; creat 0.,66; k+ 4.7  Na++ 140 liver normal albumin 3.9; hiv nr; urine culture: no growth  12-05-12: ammonia 27; iron 67; tibc 218; ferritin 409; folate 104; vit b12: 410  12-27-12: wbc 5.0; hgb 13.7; hct 39.2; mcv 86.2;plt 77; glucose 81; bun 10; creat 0.56; k+4.1; na++139  Liver normal albumin 4.0  01-24-13: wbc 3.7; hgb  12.0; hct 33.7 ;mcv 85.6 ;plt 62; glucose 86; bun 18; creat 0.47; k+3.9; na++135  02-12-13; hgb a1c 5.1  06-20-13 wbc 5.5, hgb 13.4, hct 39.2  Sodium 137, potassium 4.1, glucose 99, BUN 13, Cr 0.54, alk phos 62, AST 42, ALT 55  hgb A1c 5.3  Lipid Profile  Result: 08/22/2013 10:20 AM ( Status: F ) C  Cholesterol 142 0-200 mg/dL SLN C  Triglyceride 70 <150 mg/dL SLN  HDL Cholesterol 37 L >39 mg/dL SLN  Total Chol/HDL Ratio 3.8 Ratio SLN  VLDL Cholesterol (Calc) 14 0-40 mg/dL SLN  LDL Cholesterol (Calc) 91 0-99 mg/dL SLN C  Vitamin D (25-Hydroxy)  Result: 08/23/2013 2:15 AM ( Status: F )  Vitamin D (25-Hydroxy) 34 30-89 ng/mL SLN C  Comprehensive Metabolic Panel  Result: 04/16/5725 11:00 AM ( Status: P ) C  Sodium 137 135-145 mEq/L SLN  Potassium 4.2 3.5-5.3 mEq/L SLN  Chloride 103 96-112 mEq/L SLN  CO2 20 19-32 mEq/L SLN  Glucose 76 70-99 mg/dL SLN  BUN 13 6-23 mg/dL SLN  Creatinine 0.58 0.50-1.35 mg/dL SLN  Bilirubin, Total 0.7 0.3-1.2 mg/dL SLN  Alkaline Phosphatase 65 39-117 U/L SLN  AST/SGOT 47 H 0-37 U/L SLN  ALT/SGPT 53 0-53 U/L SLN  Total Protein 7.0 6.0-8.3 g/dL SLN  Albumin 3.7 3.5-5.2 g/dL SLN  Calcium 8.8    Assessment/Plan 1. HYPERTENSION -blood pressure remains on the low side, pt getting low dose lisinopril, will stop this at this time  2. Type II or unspecified type diabetes mellitus with neurological manifestations, not stated as uncontrolled(250.60) -off all medications; blood sugar remains stable  3. PARKINSON'S DISEASE -stable without acute changes in the last month -conts on sinemet and mirapex  4. CONSTIPATION -stable on colace BID  5. Unspecified vitamin D deficiency -conts on vit d 2000 units daily  6. Weight loss -staff reports pt eats 50% if that; will cont supplements and add Remeron 7.5 mg qhs for appetite  -conts on dysphagia diet

## 2013-11-09 ENCOUNTER — Other Ambulatory Visit: Payer: Self-pay | Admitting: *Deleted

## 2013-11-09 MED ORDER — LORAZEPAM 0.5 MG PO TABS: ORAL_TABLET | ORAL | Status: AC

## 2013-11-09 MED ORDER — MORPHINE SULFATE (CONCENTRATE) 20 MG/ML PO SOLN: ORAL | Status: DC

## 2013-11-09 NOTE — Telephone Encounter (Signed)
[facility] 

## 2013-11-13 ENCOUNTER — Other Ambulatory Visit: Payer: Self-pay | Admitting: *Deleted

## 2013-11-13 MED ORDER — MORPHINE SULFATE (CONCENTRATE) 20 MG/ML PO SOLN: ORAL | Status: AC

## 2013-11-13 MED ORDER — LORAZEPAM 1 MG PO TABS: ORAL_TABLET | ORAL | Status: AC

## 2013-11-29 IMAGING — CT CT HEAD W/O CM
3 of 4 series · 15 of 30 positions shown, 17 images · non-contrast
Comparison: Head CT 12/06/2011.

CT HEAD

CLINICAL DATA: History of trauma from a fall with injury to the
head.

CT HEAD WITHOUT CONTRAST
CT CERVICAL SPINE WITHOUT CONTRAST
TECHNIQUE: Multidetector CT imaging of the head and cervical spine
was performed following the standard protocol without intravenous
contrast.  Multiplanar CT image reconstructions of the cervical
spine were also generated.

[Series 3: head w/o · axial · non-contrast · 0.43mm/px · z∈[-61,-11]mm · 2 of 32 slices shown]
[im 11/32  brain]
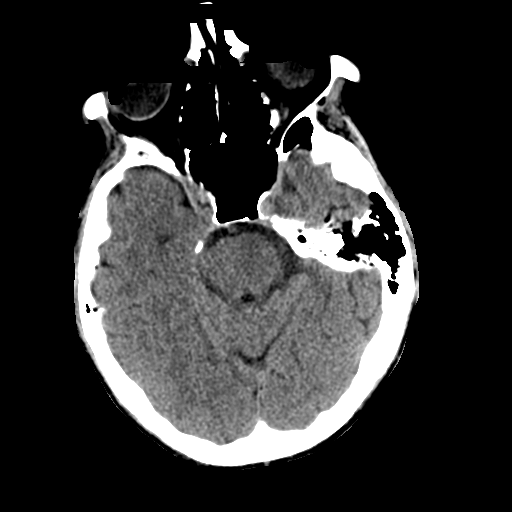
[im 21/32  brain]
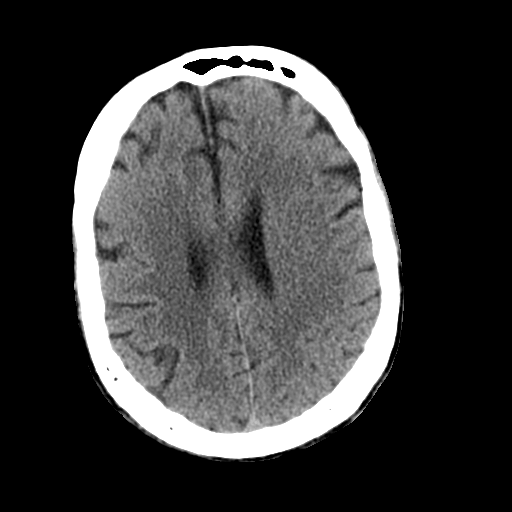

[Series 4: bone windows · axial · 0.43mm/px · z∈[-87,+15]mm · 5 of 52 slices shown]
[im 9/52  bone]
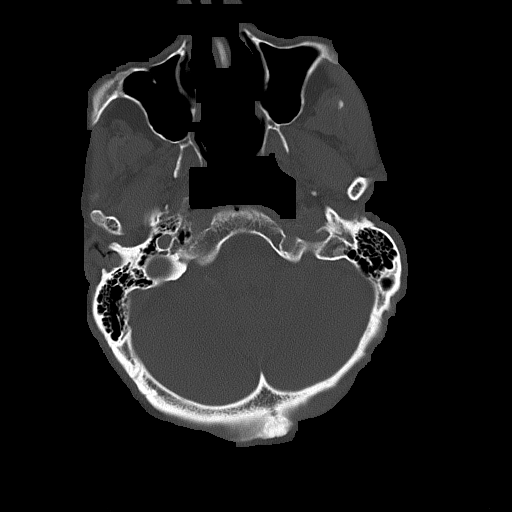
[im 18/52  bone]
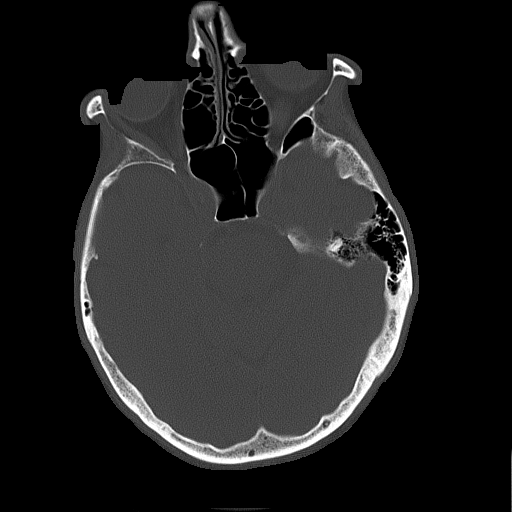
[im 26/52  bone]
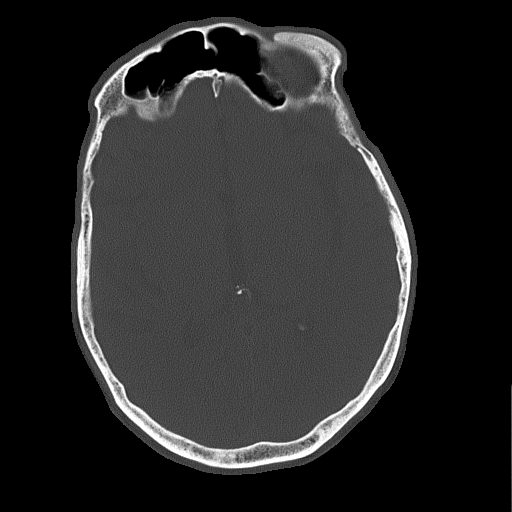
[im 35/52  bone]
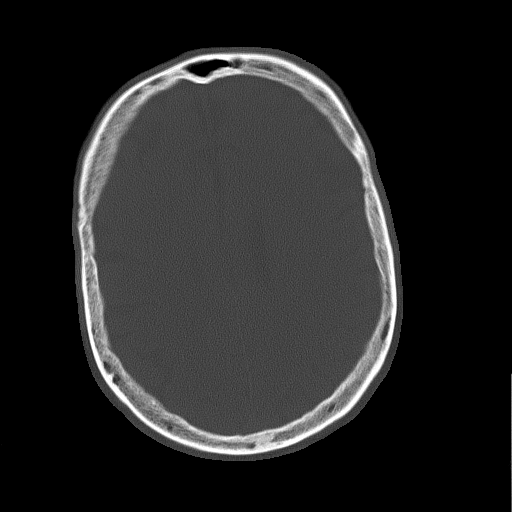
[im 43/52  bone]
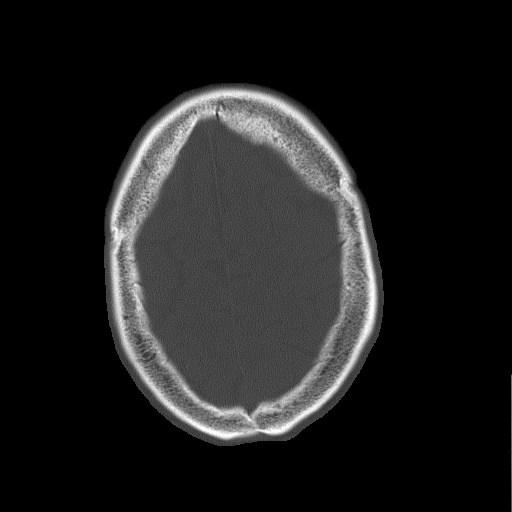

[Series 8: axial recon · axial · 0.26mm/px · z∈[-235,-136]mm · 8 of 73 slices shown, 10 images]
[im 9/73  brain]
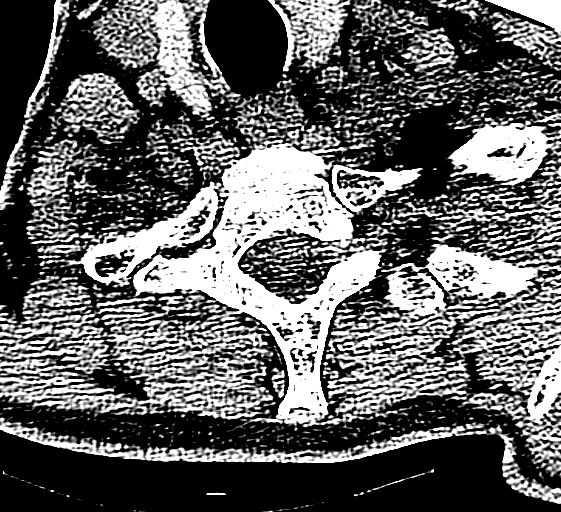
[im 9/73  bone]
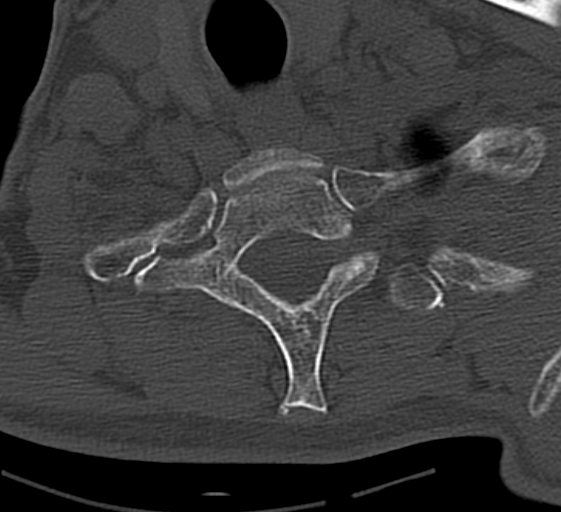
[im 17/73  brain]
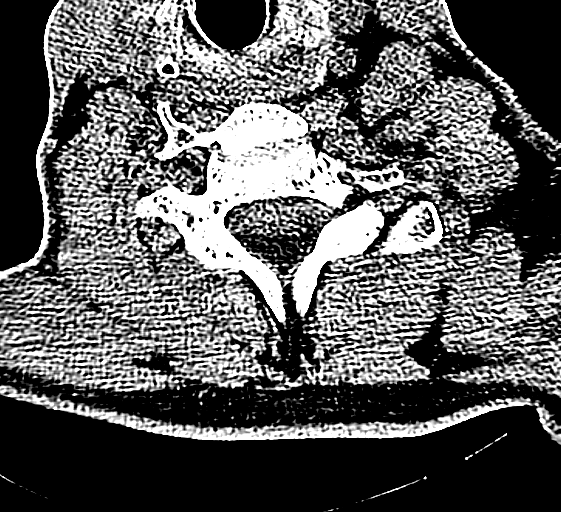
[im 25/73  brain]
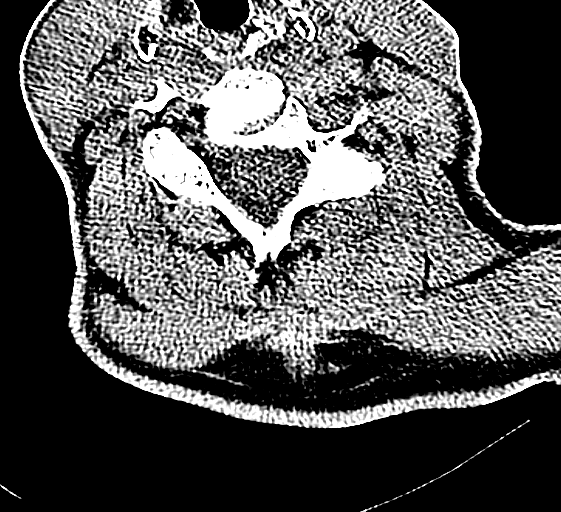
[im 33/73  brain]
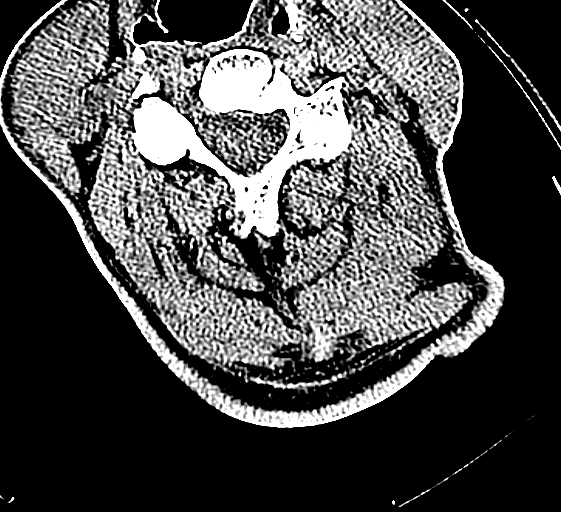
[im 41/73  brain]
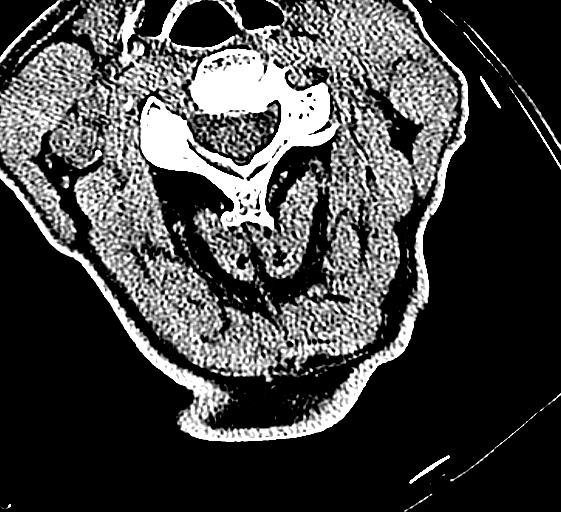
[im 41/73  bone]
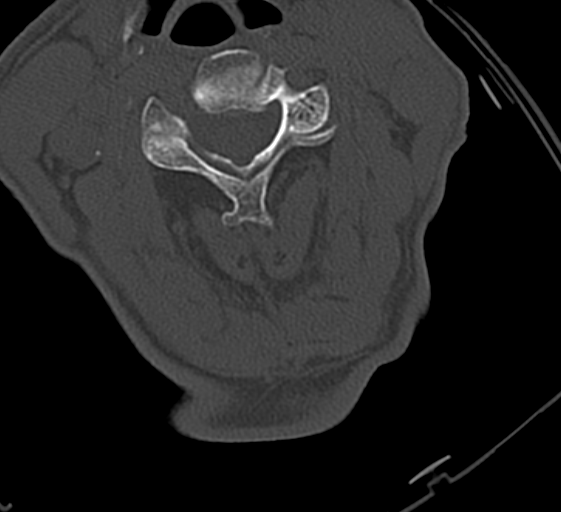
[im 49/73  brain]
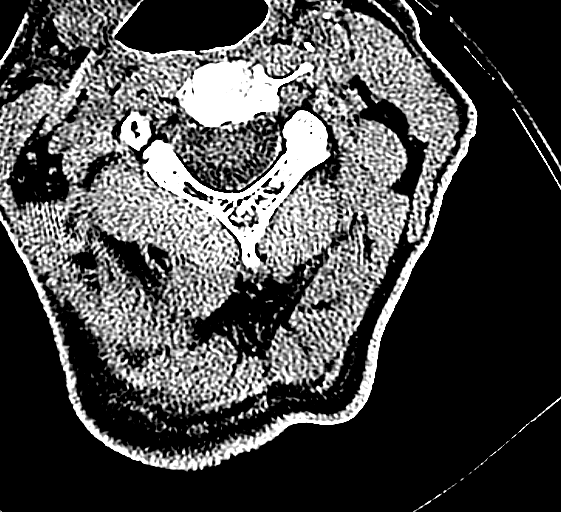
[im 57/73  brain]
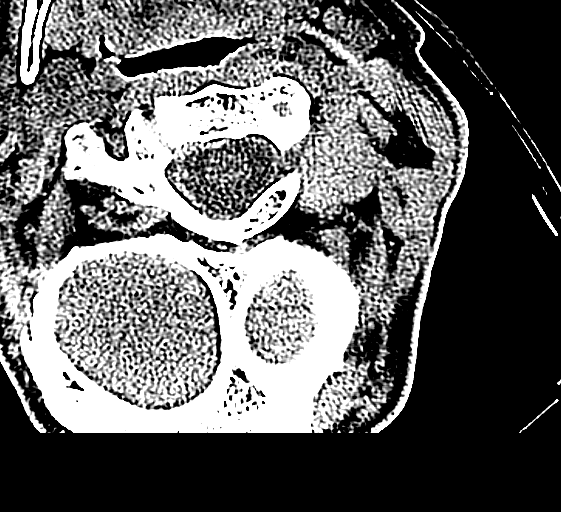
[im 65/73  brain]
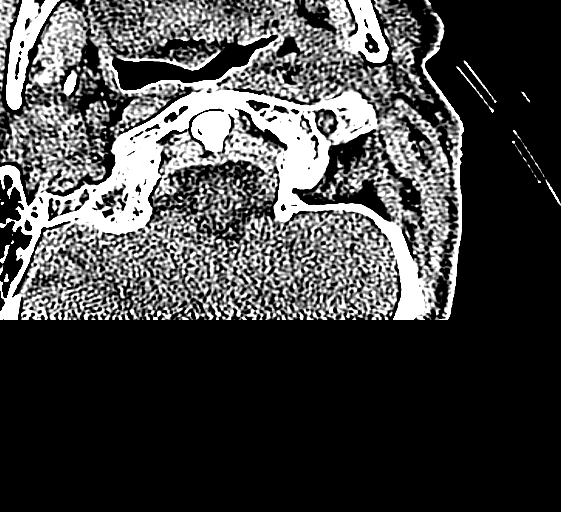

[15 of 30 positions shown; findings below may reference images not displayed]

FINDINGS: Mild cerebral atrophy.  Patchy areas of mild decreased
attenuation throughout the deep and periventricular white matter of
the cerebral hemispheres bilaterally, suggestive of mild chronic
microvascular ischemic disease. No acute displaced skull fractures
are identified.  No acute intracranial abnormality.  Specifically,
no evidence of acute post-traumatic intracranial hemorrhage, no
definite regions of acute/subacute cerebral ischemia, no focal
mass, mass effect, hydrocephalus or abnormal intra or extra-axial
fluid collections.  The visualized paranasal sinuses and mastoids
are well pneumatized.
IMPRESSION: 1.  No acute displaced skull fractures or evidence to suggest
significant acute traumatic injury to the brain.
2.  Mild cerebral atrophy and chronic microvascular ischemic
changes in the white matter, as above.

CT CERVICAL SPINE
FINDINGS: No acute displaced fractures of the cervical spine.
There is mild multilevel degenerative disc disease, most severe C4-
C5 and C6-C7.  At C6-C7 there is approximately 3 mm of
anterolisthesis of C6 upon C7.  Mild exaggeration of the normal
cervical lordosis.  Alignment is otherwise anatomic.  Prevertebral
soft tissues are normal.  Visualized portions of the upper thorax
are unremarkable.
IMPRESSION: 1.  No evidence of significant acute traumatic injury to the
cervical spine.
2.  Mild multilevel degenerative disc disease, as above.

## 2013-12-05 NOTE — Telephone Encounter (Signed)
[facility] 

## 2013-12-05 DEATH — deceased
